# Patient Record
Sex: Female | Born: 1950 | Race: White | Hispanic: No | Smoking: Former smoker
Health system: Southern US, Community
[De-identification: ages and names within clinical notes are randomized; demographics above are authoritative.]

## PROBLEM LIST (undated history)

## (undated) DIAGNOSIS — T7840XA Allergy, unspecified, initial encounter: Secondary | ICD-10-CM

## (undated) HISTORY — DX: Allergy, unspecified, initial encounter: T78.40XA

## (undated) HISTORY — PX: TUBAL LIGATION: SHX77

---

## 2002-06-11 ENCOUNTER — Encounter: Payer: Self-pay | Admitting: Family Medicine

## 2002-06-11 ENCOUNTER — Encounter: Admission: RE | Admit: 2002-06-11 | Discharge: 2002-06-11 | Payer: Self-pay | Admitting: Family Medicine

## 2002-08-04 ENCOUNTER — Ambulatory Visit (HOSPITAL_COMMUNITY): Admission: RE | Admit: 2002-08-04 | Discharge: 2002-08-04 | Payer: Self-pay | Admitting: *Deleted

## 2012-01-16 ENCOUNTER — Ambulatory Visit (INDEPENDENT_AMBULATORY_CARE_PROVIDER_SITE_OTHER): Payer: BC Managed Care – PPO | Admitting: Family Medicine

## 2012-01-16 VITALS — BP 111/75 | HR 70 | Temp 97.3°F | Resp 16 | Ht 63.0 in | Wt 183.0 lb

## 2012-01-16 DIAGNOSIS — H9209 Otalgia, unspecified ear: Secondary | ICD-10-CM

## 2012-01-16 MED ORDER — OFLOXACIN 0.3 % OT SOLN: 10.0000 [drp] | Freq: Every day | OTIC | Status: AC

## 2012-01-16 NOTE — Progress Notes (Signed)
  Patient Name: ZYIA KANEKO Date of Birth: [birth year] Medical Record Number: [id number] Gender: female Date of Encounter: 01/16/2012  History of Present Illness:  Veronica Joseph is a 61 y.o. very pleasant female patient who presents with the following:  Here today with right ear pain for about one week.  No hearing change, no fever, no drainage, no other symptoms.    There is no problem list on file for this patient.  No past medical history on file. No past surgical history on file. History  Substance Use Topics  . Smoking status: Never Smoker   . Smokeless tobacco: Not on file  . Alcohol Use: Not on file   No family history on file. No Known Allergies  Medication list has been reviewed and updated.  Review of Systems: As per HPI- otherwise negative.   Physical Examination: Filed Vitals:   01/16/12 1448  BP: 111/75  Pulse: 70  Temp: 97.3 F (36.3 C)  TempSrc: Oral  Resp: 16  Height: 5\' 3"  (1.6 m)  Weight: 183 lb (83.008 kg)    Body mass index is 32.42 kg/(m^2).  GEN: WDWN, NAD, Non-toxic, A & O x 3, overweight HEENT: Atraumatic, Normocephalic. Neck supple. No masses, No LAD. Left Tm and ear canal normal- right obscured by wax.   Ears and Nose: No external deformity. CV: RRR, No M/G/R. No JVD. No thrill. No extra heart sounds. PULM: CTA B, no wheezes, crackles, rhonchi. No retractions. No resp. distress. No accessory muscle use. ABD: S, NT, ND, +BS. No rebound. No HSM. EXTR: No c/c/e NEURO Normal gait.  PSYCH: Normally interactive. Conversant. Not depressed or anxious appearing.  Calm demeanor.   Tried for some time to flush wax from ear canal but could not remove it.  Visualized TM is normal Assessment and Plan: 1. Ear pain  ofloxacin (FLOXIN OTIC) 0.3 % otic solution   Cover with floxin otic due to pain and irrigation.  Recommended that she try OTC wax softening drops and then RTC for repeat irrigation if she continues to have pain.

## 2012-05-09 ENCOUNTER — Ambulatory Visit (INDEPENDENT_AMBULATORY_CARE_PROVIDER_SITE_OTHER): Payer: BC Managed Care – PPO | Admitting: Emergency Medicine

## 2012-05-09 VITALS — BP 127/80 | HR 76 | Temp 98.0°F | Resp 16 | Ht 64.0 in | Wt 181.8 lb

## 2012-05-09 DIAGNOSIS — J029 Acute pharyngitis, unspecified: Secondary | ICD-10-CM

## 2012-05-09 LAB — POCT RAPID STREP A (OFFICE): Rapid Strep A Screen: NEGATIVE

## 2012-05-09 MED ORDER — PREDNISONE 10 MG PO TABS: ORAL_TABLET | ORAL | Status: DC

## 2012-05-09 NOTE — Progress Notes (Signed)
  Subjective:    Patient ID: Veronica Joseph, female    DOB: April 04, 1951, 61 y.o.   MRN: [id number]  HPI redness around both eyes. Bright red looks like a "racoon". No change in soaps, makeup, skin care, nothing. Feels "leathery and scruffy"  Woke up yesterday morning with scratchy throat, dry cough, and low grade fever   Review of Systems     Objective:   Physical Exam there is some dry scaly redness to the skin which isn't in a raccoon distribution around both eyes. Her pupils are equal and reactive to light. Neck is supple. Chest is clear TMs are clear.    Results for orders placed in visit on 05/09/12  POCT RAPID STREP A (OFFICE)      Component Value Range   Rapid Strep A Screen Negative  Negative      Assessment & Plan:  Patient with what appears to be irritation around both eyes. She did clean out a storage area and had a lot of exposure to dust around her eyes and this may be the precipitant . to keep all ointments and cremes off her face. we'll treat with prednisone. Recheck if symptoms continue

## 2014-04-01 ENCOUNTER — Ambulatory Visit (INDEPENDENT_AMBULATORY_CARE_PROVIDER_SITE_OTHER): Payer: BC Managed Care – PPO | Admitting: Family Medicine

## 2014-04-01 VITALS — BP 118/84 | HR 80 | Temp 97.2°F | Resp 16 | Ht 64.0 in | Wt 192.6 lb

## 2014-04-01 DIAGNOSIS — M7580 Other shoulder lesions, unspecified shoulder: Secondary | ICD-10-CM

## 2014-04-01 DIAGNOSIS — M7540 Impingement syndrome of unspecified shoulder: Secondary | ICD-10-CM

## 2014-04-01 DIAGNOSIS — M25819 Other specified joint disorders, unspecified shoulder: Secondary | ICD-10-CM

## 2014-04-01 MED ORDER — PREDNISONE 20 MG PO TABS: ORAL_TABLET | ORAL | Status: DC

## 2014-04-01 NOTE — Progress Notes (Signed)
° °  Subjective:  Patient ID: Veronica Joseph, female    DOB: [birth year], 63 y.o.   MRN: [id number] This chart was scribed for Elvina Sidle, MD by Marica Otter, ED Scribe at [facility]. This patient was seen in room Room 5 and the patient's care was started at 11:48 AM.  HPI HPI Comments: Veronica Joseph is a 63 y.o. female who presents to the [facility] complaining of intermittent left arm pain onset 3-4 weeks ago. Specifically, pt reports that raising her left arm or moving her left arm up above the shoulders causes severe pain. Pt denies neck pain or neck stiffness and reports ROM of the neck is at baseline. Pt reports trying to treat the pain with ice and heat and otc meds without much relief. Pt reports she was helping her daughter move before the start of her Sx; as such, pt was doing quite a bit of lifting and reaching. Pt reports she is done with the move.   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     Review of Systems  Constitutional: Negative for fever and chills.  Musculoskeletal: Negative for neck pain and neck stiffness.       Left arm pain    Objective:  Physical Exam CONSTITUTIONAL: Well developed/well nourished HEAD: Normocephalic/atraumatic EYES: EOMI/PERRL ENMT: Mucous membranes moist NECK: supple no meningeal signs SPINE:entire spine nontender CV: S1/S2 noted, no murmurs/rubs/gallops noted LUNGS: Lungs are clear to auscultation bilaterally, no apparent distress ABDOMEN: soft, nontender, no rebound or guarding GU:no cva tenderness NEURO: Pt is awake/alert, moves all extremitiesx4 EXTREMITIES: pulses normal; ROM normal; findings consistent with left shoulder impingement  Abduction with beyond 90 degrees; and internal rotation to 90 degrees; shoulder is non tender; no bony apnormality or swelling; overlying skin is unremarkable  SKIN: warm, color  normal PSYCH: no abnormalities of mood noted  Assessment & Plan:  DIAGNOSTIC STUDIES: Oxygen Saturation is 99% on RA, normal by my interpretation.     Shoulder impingement - Plan: predniSONE (DELTASONE) 20 MG tablet  Signed, Elvina Sidle, MD

## 2014-04-01 NOTE — Patient Instructions (Signed)

## 2014-04-18 ENCOUNTER — Ambulatory Visit (INDEPENDENT_AMBULATORY_CARE_PROVIDER_SITE_OTHER): Payer: BC Managed Care – PPO | Admitting: Family Medicine

## 2014-04-18 VITALS — BP 122/80 | HR 82 | Temp 97.4°F | Resp 16 | Ht 63.0 in | Wt 190.4 lb

## 2014-04-18 DIAGNOSIS — IMO0002 Reserved for concepts with insufficient information to code with codable children: Secondary | ICD-10-CM

## 2014-04-18 DIAGNOSIS — S46012S Strain of muscle(s) and tendon(s) of the rotator cuff of left shoulder, sequela: Secondary | ICD-10-CM

## 2014-04-18 NOTE — Patient Instructions (Signed)
We will get you referred to an orthopedist for your shoulder.   In the meantime try to keep your shoulder moving as much as you can.   I do think you have a strain of your rotator cuff, but hopefully do not have a tear.  If you don't hear about your appointment in the next day or so please let me know!

## 2014-04-18 NOTE — Progress Notes (Signed)
[facility] [address], [place] [address] [phone]  Date:  04/18/2014   Name:  Veronica Joseph   DOB:  January 01, 1951   MRN:  [id number]  PCP:  No PCP Per Patient    Chief Complaint: Follow-up   History of Present Illness:  Veronica Joseph is a 63 y.o. very pleasant female patient who presents with the following:  Here today to recheck a shoulder issue.  She was here about 15 days ago with complain of left shoulder pain for a few weeks.  She was treated with PO prednisone and given exercises for impingement syndrome.  She is back because she has not improved and was told to follow-up in this case.  She cannot internally rotate or abduct.  He pain seems to be over the lateral upper arm.   The prednisone did not seem to help. She is not aware of any particular injury, but was helping her daughter to move recently and did do some overhead lifting.  She has never had this in the past, but the right shoulder is ok.   She is generally healthy.  She is not established with orthopedics currently.    There are no active problems to display for this patient.   History reviewed. No pertinent past medical history.  History reviewed. No pertinent past surgical history.  History  Substance Use Topics  . Smoking status: Never Smoker   . Smokeless tobacco: Not on file  . Alcohol Use: Not on file    History reviewed. No pertinent family history.  No Known Allergies  Medication list has been reviewed and updated.  Current Outpatient Prescriptions on File Prior to Visit  Medication Sig Dispense Refill  . predniSONE (DELTASONE) 20 MG tablet Two daily with food  10 tablet  0   No current facility-administered medications on file prior to visit.    Review of Systems:  As per HPI- otherwise negative.   Physical Examination: Filed Vitals:   04/18/14 1357  BP: 122/80  Pulse: 82  Temp: 97.4 F (36.3 C)  Resp: 16   Filed Vitals:   04/18/14 1357  Height:  5\' 3"  (1.6 m)  Weight: 190 lb 6.4 oz (86.365 kg)   Body mass index is 33.74 kg/(m^2). Ideal Body Weight: Weight in (lb) to have BMI = 25: 140.8  GEN: WDWN, NAD, Non-toxic, A & O x 3, overweight, looks well HEENT: Atraumatic, Normocephalic. Neck supple. No masses, No LAD. Ears and Nose: No external deformity. CV: RRR, No M/G/R. No JVD. No thrill. No extra heart sounds. PULM: CTA B, no wheezes, crackles, rhonchi. No retractions. No resp. distress. No accessory muscle use. ABD: S, NT, ND, +BS. No rebound. No HSM. EXTR: No c/c/e NEURO Normal gait.  PSYCH: Normally interactive. Conversant. Not depressed or anxious appearing.  Calm demeanor.  Left shoulder; able to flex to about 110 degrees.  Can abduct to 90 degrees.  External rotation and internal rotation about 30% less than I would expect.  Normal strength, negative empty can test.  Tender over the anterior RCT insertion  Assessment and Plan: Rotator cuff strain, left, sequela - Plan: Ambulatory referral to Orthopedic Surgery  Veronica Joseph is here today with a left rotator cuff strain.  Do not suspect a tear, but as she has not improved yet will refer to ortho- may need a joint injection and formal PT.  Made appt for later this week and she was called regarding her appt.  Signed Lamar Blinks, MD

## 2014-07-23 ENCOUNTER — Ambulatory Visit (INDEPENDENT_AMBULATORY_CARE_PROVIDER_SITE_OTHER): Payer: BC Managed Care – PPO | Admitting: Physician Assistant

## 2014-07-23 VITALS — BP 118/80 | HR 78 | Temp 97.7°F | Resp 14 | Ht 62.75 in | Wt 180.0 lb

## 2014-07-23 DIAGNOSIS — H5789 Other specified disorders of eye and adnexa: Secondary | ICD-10-CM

## 2014-07-23 DIAGNOSIS — Z23 Encounter for immunization: Secondary | ICD-10-CM

## 2014-07-23 NOTE — Patient Instructions (Signed)
Can use Normal Saline drops to wash eye out. Redness should clear up in a few days

## 2014-07-23 NOTE — Progress Notes (Signed)
   Subjective:    Patient ID: Veronica Joseph, female    DOB: 1951/07/11, 63 y.o.   MRN: [id number]  HPI Patient presents to clinic this morning after waking last night with R eye itching. She was able to get a small grain of sand out of eye (grain likely from facial wash granules). Eye is no longer itching however is now red. Patient wanted to confirm it was not pink eye. She has not tried used cool cloth or eye drops. She denies recent illness and says seasonal allergies well controlled with Claritin.   Review of Systems  Constitutional: Negative.   HENT: Negative for congestion, dental problem, ear discharge, ear pain, facial swelling, postnasal drip, rhinorrhea, sinus pressure, sneezing and sore throat.   Eyes: Positive for discharge and redness. Negative for photophobia, pain, itching and visual disturbance.       No mucus, burning, swelling, trauma, or change in vision. Watery discharge. Foreign body feeling last night; not currently present.  Respiratory: Negative for cough, chest tightness, shortness of breath and wheezing.   Cardiovascular: Negative.   Gastrointestinal: Negative.   Endocrine: Negative.   Genitourinary: Negative.   Musculoskeletal: Negative.   Skin: Negative.   Allergic/Immunologic: Positive for environmental allergies. Negative for food allergies.  Neurological: Negative for dizziness, light-headedness and headaches.       Objective:   Physical Exam  Constitutional: She is oriented to person, place, and time. She appears well-developed and well-nourished.  HENT:  Head: Normocephalic and atraumatic.  Right Ear: External ear normal.  Left Ear: External ear normal.  Mouth/Throat: Oropharynx is clear and moist.  Eyes: EOM are normal. Pupils are equal, round, and reactive to light. Lids are everted and swept, no foreign bodies found. Right eye exhibits discharge. Right eye exhibits no exudate and no hordeolum. No foreign body present in the right eye. Left eye  exhibits no discharge, no exudate and no hordeolum. No foreign body present in the left eye. Right conjunctiva is injected. Right conjunctiva has no hemorrhage. Left conjunctiva is not injected. Left conjunctiva has no hemorrhage. No scleral icterus.    R eye mildly injected.  Neck: Neck supple. No JVD present. No thyromegaly present.  Cardiovascular: Normal rate, regular rhythm and normal heart sounds.  Exam reveals no gallop and no friction rub.   No murmur heard. Pulmonary/Chest: Effort normal and breath sounds normal. No respiratory distress. She has no wheezes. She has no rales.  Abdominal: Soft. Bowel sounds are normal. She exhibits no mass. There is no tenderness. There is no guarding.  Lymphadenopathy:    She has no cervical adenopathy.  Neurological: She is alert and oriented to person, place, and time.  Skin: Skin is warm and dry. No rash noted. No erythema.       Assessment & Plan:   1. Redness of eye, right Can use normal saline drops to wash eye out. Redness should clear up in a few days as foreign body (sand granule) that cause redness has been removed by patient.   2. Needs flu shot - Flu Vaccine QUAD 36+ mos IM  3. Need for Tdap vaccination - Tdap vaccine greater than or equal to 7yo IM

## 2014-07-23 NOTE — Progress Notes (Signed)
I was directly involved with the patient's care and agree with the physical, diagnosis and treatment plan.  

## 2014-08-07 ENCOUNTER — Ambulatory Visit (INDEPENDENT_AMBULATORY_CARE_PROVIDER_SITE_OTHER): Payer: BC Managed Care – PPO | Admitting: Emergency Medicine

## 2014-08-07 VITALS — BP 114/72 | HR 75 | Temp 97.8°F | Resp 16 | Ht 63.0 in | Wt 185.0 lb

## 2014-08-07 DIAGNOSIS — H1030 Unspecified acute conjunctivitis, unspecified eye: Secondary | ICD-10-CM

## 2014-08-07 MED ORDER — OFLOXACIN 0.3 % OP SOLN: 1.0000 [drp] | OPHTHALMIC | Status: DC

## 2014-08-07 NOTE — Patient Instructions (Signed)

## 2014-08-07 NOTE — Progress Notes (Signed)
[facility] [address], [place] [address] [phone]  Date:  08/07/2014   Name:  Veronica Joseph   DOB:  Mar 17, 1951   MRN:  [id number]  PCP:  No PCP Per Patient    Chief Complaint: Conjunctivitis   History of Present Illness:  Veronica Joseph is a 63 y.o. very pleasant female patient who presents with the following:  Seen two weeks ago for conjunctival injection presumed to be related to a foreign body removed by irrigation by the patient. Now has recurrent FB sensation right eye.  Manipulated eye and not able to find a FB.  Generalized injection.  There are no active problems to display for this patient.   Past Medical History  Diagnosis Date  . Allergy     History reviewed. No pertinent past surgical history.  History  Substance Use Topics  . Smoking status: Former Smoker -- 0.25 packs/day    Types: Cigarettes    Quit date: 10/28/1978  . Smokeless tobacco: Never Used  . Alcohol Use: No    No family history on file.  No Known Allergies  Medication list has been reviewed and updated.  Current Outpatient Prescriptions on File Prior to Visit  Medication Sig Dispense Refill  . loratadine (CLARITIN) 10 MG tablet Take 10 mg by mouth daily.       No current facility-administered medications on file prior to visit.    Review of Systems:  As per HPI, otherwise negative.    Physical Examination: Filed Vitals:   08/07/14 0812  BP: 114/72  Pulse: 75  Temp: 97.8 F (36.6 C)  Resp: 16   Filed Vitals:   08/07/14 0812  Height: 5\' 3"  (1.6 m)  Weight: 185 lb (83.915 kg)   Body mass index is 32.78 kg/(m^2). Ideal Body Weight: Weight in (lb) to have BMI = 25: 140.8   GEN: WDWN, NAD, Non-toxic, Alert & Oriented x 3 HEENT: Atraumatic, Normocephalic. No FB.  Generalized injection with marginal exudate Ears and Nose: No external deformity. EXTR: No clubbing/cyanosis/edema NEURO: Normal gait.  PSYCH: Normally interactive.  Conversant. Not depressed or anxious appearing.  Calm demeanor.    Assessment and Plan: Conjunctivitis Ocuflox  Signed,  Phillips OdorJeffery Alfonse Garringer, MD

## 2014-12-01 ENCOUNTER — Ambulatory Visit (INDEPENDENT_AMBULATORY_CARE_PROVIDER_SITE_OTHER): Payer: BC Managed Care – PPO | Admitting: Physician Assistant

## 2014-12-01 VITALS — BP 124/70 | Temp 97.9°F | Resp 18 | Ht 63.0 in | Wt 183.0 lb

## 2014-12-01 DIAGNOSIS — R253 Fasciculation: Secondary | ICD-10-CM

## 2014-12-01 DIAGNOSIS — R258 Other abnormal involuntary movements: Secondary | ICD-10-CM

## 2014-12-01 NOTE — Progress Notes (Signed)
  Medical screening examination/treatment/procedure(s) were performed by non-physician practitioner and as supervising physician I was immediately available for consultation/collaboration.     

## 2014-12-01 NOTE — Patient Instructions (Addendum)
I think the spasms/twitches in your left thumb are mostly likely due to nerve irritation from the repetitive motions of your new job. 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 Please wear the thumb splint while at work to see if that helps at all. Be sure to take the splint off several times daily to ensure you're getting a lot of range of motion with the thumb. I'll be in contact with you with the labs we drew today. If you notice the twitches increasing, moving to different areas, if you notice any weakness, please return to clinic for further evaluation.  Please take ibuprofen up to 600 mg every 6 hours as needed for inflammation. Please return to clinic if the twitching continues after all these measures.

## 2014-12-01 NOTE — Progress Notes (Signed)
Subjective:    Patient ID: Veronica Joseph, female    DOB: Jun 09, 1951, 64 y.o.   MRN: [id number]  Chief Complaint  Patient presents with  . Spasms    Left thumb since [id number] no pain, no fever   Prior to Admission medications   Medication Sig Start Date End Date Taking? Authorizing Provider  loratadine (CLARITIN) 10 MG tablet Take 10 mg by mouth daily.   Yes Historical Provider, MD  ofloxacin (OCUFLOX) 0.3 % ophthalmic solution Place 1 drop into the right eye every 4 (four) hours. Patient not taking: Reported on 12/01/2014 08/07/14   Carmelina DaneJeffery S Anderson, MD   Medications, allergies, past medical history, surgical history, family history, social history and problem list reviewed and updated.  HPI  [age] yof with no pertinent pmh presents with left thumb twitches.  Has had left thumb twitches for past 3 wks. Twitches come on at random times. Last for seconds at a time. Have been steady since they started 3 wks ago. She can go days btwn the twitches. She can actually see the spasm in her thumb when it happens.   She started a new job 5 months ago. 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 She denies any weakness, any twitches anywhere else, any pain, any trauma to the area. Pt is concerned about parkinsons.   Denies progressive nature of twitch. Has been steady since onset. No trouble walking.   eReview of Systems No cp, sob, fever, chills.     Objective:   Physical Exam  Constitutional: She is oriented to person, place, and time. She appears well-developed and well-nourished.  Non-toxic appearance. She does not have a sickly appearance. She does not appear ill. No distress.  BP 124/70 mmHg  Temp(Src) 97.9 F (36.6 C) (Oral)  Resp 18  Ht 5\' 3"  (1.6 m)  Wt 183 lb (83.008 kg)  BMI 32.43 kg/m2   Eyes: Conjunctivae and EOM are normal. Pupils are equal, round, and reactive to light.  Neck: No thyroid mass and no thyromegaly present.    Cardiovascular: Normal rate, regular rhythm and normal heart sounds.   Pulmonary/Chest: Effort normal and breath sounds normal.  Musculoskeletal: Normal range of motion.       Left hand: She exhibits normal range of motion, no tenderness, no bony tenderness, normal capillary refill and no deformity. Normal sensation noted. Normal strength noted.  Left thumb testing normal. No nodule over tendon sheath. Neg finkelsteins testing.   Neurological: She is alert and oriented to person, place, and time. She has normal strength. She displays no tremor. No cranial nerve deficit or sensory deficit. She displays a negative Romberg sign. Coordination and gait normal.  No slowed rapid alternating movements.   Skin: Skin is warm and dry. No rash noted. Nails show no clubbing.  Psychiatric: She has a normal mood and affect. Her speech is normal and behavior is normal.      Assessment & Plan:   [age] yof with no pertinent pmh presents with left thumb twitches.  Muscle twitch - Plan: Magnesium, Comprehensive metabolic panel, TSH --most likely due to repetitive nature of new job causing nerve irritation --change work motions, thumb splint, nsaids prn --doubt als/parkinsons/ms --> is twitch not a tremor, normal neuro testing, normal strength, has not been progressive over these past few wks, denies any changes anywhere else --tsh, cmp, mag to check for lyte abnor, thyroid issues as poss causes --rtc if no improvement with  these measures, if worsens, if any weakness, if moves to other areas  Donnajean Lopes, PA-C Physician Assistant-Certified [facility]  12/01/2014 7:30 PM

## 2014-12-02 LAB — COMPREHENSIVE METABOLIC PANEL
ALK PHOS: 72 U/L (ref 39–117)
ALT: 15 U/L (ref 0–35)
AST: 20 U/L (ref 0–37)
Albumin: 4.5 g/dL (ref 3.5–5.2)
BUN: 15 mg/dL (ref 6–23)
CALCIUM: 9.4 mg/dL (ref 8.4–10.5)
CHLORIDE: 102 meq/L (ref 96–112)
CO2: 24 mEq/L (ref 19–32)
Creat: 0.75 mg/dL (ref 0.50–1.10)
Glucose, Bld: 97 mg/dL (ref 70–99)
POTASSIUM: 4.2 meq/L (ref 3.5–5.3)
SODIUM: 138 meq/L (ref 135–145)
TOTAL PROTEIN: 7.2 g/dL (ref 6.0–8.3)
Total Bilirubin: 0.6 mg/dL (ref 0.2–1.2)

## 2014-12-02 LAB — MAGNESIUM: MAGNESIUM: 2.2 mg/dL (ref 1.5–2.5)

## 2014-12-02 LAB — TSH: TSH: 1.428 u[IU]/mL (ref 0.350–4.500)

## 2017-04-04 ENCOUNTER — Ambulatory Visit (INDEPENDENT_AMBULATORY_CARE_PROVIDER_SITE_OTHER): Payer: BC Managed Care – PPO | Admitting: Urgent Care

## 2017-04-04 ENCOUNTER — Ambulatory Visit (INDEPENDENT_AMBULATORY_CARE_PROVIDER_SITE_OTHER): Payer: BC Managed Care – PPO

## 2017-04-04 ENCOUNTER — Encounter: Payer: Self-pay | Admitting: Urgent Care

## 2017-04-04 VITALS — BP 128/76 | HR 85 | Temp 97.9°F | Resp 16 | Ht 63.0 in | Wt 177.2 lb

## 2017-04-04 DIAGNOSIS — M79645 Pain in left finger(s): Secondary | ICD-10-CM | POA: Diagnosis not present

## 2017-04-04 DIAGNOSIS — F5109 Other insomnia not due to a substance or known physiological condition: Secondary | ICD-10-CM

## 2017-04-04 DIAGNOSIS — M25542 Pain in joints of left hand: Secondary | ICD-10-CM

## 2017-04-04 MED ORDER — SUVOREXANT 10 MG PO TABS: 1.0000 | ORAL_TABLET | Freq: Every day | ORAL | 1 refills | Status: DC

## 2017-04-04 MED ORDER — MELOXICAM 7.5 MG PO TABS: 7.5000 mg | ORAL_TABLET | Freq: Every day | ORAL | 0 refills | Status: DC

## 2017-04-04 NOTE — Patient Instructions (Addendum)
Suvorexant oral tablets What is this medicine? SUVOREXANT (su-vor-EX-ant) is used to treat insomnia. This medicine helps you to fall asleep and sleep through the night. This medicine may be used for other purposes; ask your health care provider or pharmacist if you have questions. COMMON BRAND NAME(S): Belsomra What should I tell my health care provider before I take this medicine? They need to know if you have any of these conditions: -depression -history of a drug or alcohol abuse problem -history of daytime sleepiness -history of sudden onset of muscle weakness (cataplexy) -liver disease -lung or breathing disease -narcolepsy -suicidal thoughts, plans, or attempt; a previous suicide attempt by you or a family member -an unusual or allergic reaction to suvorexant, other medicines, foods, dyes, or preservatives -pregnant or trying to get pregnant -breast-feeding How should I use this medicine? Take this medicine by mouth within 30 minutes of going to bed. Do not take it unless you are able to stay in bed a full night before you must be active again. Follow the directions on the prescription label. For best results, it is better to take this medicine on an empty stomach. Do not take your medicine more often than directed. Do not stop taking this medicine on your own. Always follow your doctor or health care professional's advice. A special MedGuide will be given to you by the pharmacist with each prescription and refill. Be sure to read this information carefully each time. Talk to your pediatrician regarding the use of this medicine in children. Special care may be needed. Overdosage: If you think you have taken too much of this medicine contact a poison control center or emergency room at once. NOTE: This medicine is only for you. Do not share this medicine with others. What if I miss a dose? This medicine should only be taken immediately before going to sleep. Do not take double or extra  doses. What may interact with this medicine? -alcohol -antiviral medicines for HIV or AIDS -aprepitant -carbamazepine -certain antibiotics like ciprofloxacin, clarithromycin, erythromycin, telithromycin -certain medicines for depression or psychotic disturbances -certain medicines for fungal infections like ketoconazole, posaconazole, fluconazole, or itraconazole -conivaptan -digoxin -diltiazem -grapefruit juice -imatinib -medicines for anxiety or sleep -phenytoin -rifampin -verapamil This list may not describe all possible interactions. Give your health care provider a list of all the medicines, herbs, non-prescription drugs, or dietary supplements you use. Also tell them if you smoke, drink alcohol, or use illegal drugs. Some items may interact with your medicine. What should I watch for while using this medicine? Visit your doctor or health care professional for regular checks on your progress. Keep a regular sleep schedule by going to bed at about the same time each night. Avoid caffeine-containing drinks in the evening hours. When sleep medicines are used every night for more than a few weeks, they may stop working. Do not increase the dose on your own. Talk to your doctor if your insomnia worsens or is not better within 7 to 10 days. After taking this medicine for sleep, you may get up out of bed while not being fully awake and do an activity that you do not know you are doing. The next morning, you may have no memory of the event. Activities such as driving a car ("sleep-driving"), making and eating food, talking on the phone, sexual activity, and sleep-walking have been reported. Call your doctor right away if you find out you have done any of these activities. Do not take this medicine if you have   used alcohol that evening or before bed or taken another medicine for sleep, since your risk of doing these sleep-related activities will be increased. Do not take this medicine unless you  are able to stay in bed for a full night (7 to 8 hours) and do not drive or perform other activities requiring full alertness within 8 hours of a dose. Do not drive, use machinery, or do anything that needs mental alertness the day after you take the 20 mg dose of this medicine. The use of lower doses (10 mg) also has the potential to cause driving impairment the next day. You may have a decrease in mental alertness the day after use, even if you feel that you are fully awake. Tell your doctor if you will need to perform activities requiring full alertness, such as driving, the next day. Do not stand or sit up quickly after taking this medicine, especially if you are an older patient. This reduces the risk of dizzy or fainting spells. If you or your family notice any changes in your behavior, such as new or worsening depression, thoughts of harming yourself, anxiety, other unusual or disturbing thoughts, or memory loss, call your doctor right away. What side effects may I notice from receiving this medicine? Side effects that you should report to your doctor or health care professional as soon as possible: -allergic reactions like skin rash, itching or hives, swelling of the face, lips, or tongue -confusion -depressed mood -feeling faint or lightheaded, falls -hallucinations -inability to move or speak for up to several minutes while you are going to sleep or waking up -memory loss -periods of leg weakness lasting from seconds to a few minutes -problems with balance, speaking, walking -restlessness, excitability, or feelings of agitation -unusual activities while asleep like driving, eating, making phone calls Side effects that usually do not require medical attention (report to your doctor or health care professional if they continue or are bothersome): -abnormal dreams -daytime drowsiness -diarrhea -dizziness -headache This list may not describe all possible side effects. Call your doctor for  medical advice about side effects. You may report side effects to FDA at 1-800-FDA-1088. Where should I keep my medicine? Keep out of the reach of children. This medicine can be abused. Keep your medicine in a safe place to protect it from theft. Do not share this medicine with anyone. Selling or giving away this medicine is dangerous and against the law. Store at room temperature between 15 and 30 degrees C (59 and 86 degrees F). Throw away any unused medicine after the expiration date. NOTE: This sheet is a summary. It may not cover all possible information. If you have questions about this medicine, talk to your doctor, pharmacist, or health care provider.  2018 Elsevier/Gold Standard (2015-11-16 11:54:49)    Common side effects: Drowsiness (2% to 12%; dose dependent and more common in females), headache (7%; more common in females), dizziness (3%), abnormal dreams (2%; more common in females), abnormality in thinking, amnesia, anxiety, behavioral changes, central nervous system depression, drug abuse, drug dependence, exacerbation of depression, hallucination, hypnagogic hallucinations, sleep driving, suicidal ideation Endocrine & metabolic: Increased serum cholesterol Gastrointestinal: Diarrhea (2%), xerostomia ( 2%; more common in females) Neuromuscular & skeletal: Lower extremity weakness, sleep paralysis Respiratory: Cough ( 2%; more common in females), upper respiratory tract infection ( 2%; more common in females)     IF you received an x-ray today, you will receive an invoice from [facility]. Please contact [facility] at [phone]  with questions or concerns regarding your invoice.   IF you received labwork today, you will receive an invoice from [facility]. Please contact [facility] at [phone] with questions or concerns regarding your invoice.   Our billing staff will not be able to assist you with questions regarding bills from these companies.  You will  be contacted with the lab results as soon as they are available. The fastest way to get your results is to activate your My Chart account. Instructions are located on the last page of this paperwork. If you have not heard from us regarding the results in 2 weeks, please contact this office.

## 2017-04-04 NOTE — Progress Notes (Signed)
  MRN: [id number] DOB: [birth year]  Subjective:   Veronica Joseph is a 66 y.o. female presenting for chief complaint of joint pain (x3 weeks; left index finger/left shoulder/left elbow)  Finger pain - Reports 3 week history of left index finger pain at PIP joint. Has had difficulty with flexion, pain is very sharp and intermittent. She does work at [employer] that involves frequent heavy lifting of boxes and due to her pain has dropped a few boxes. Has been using ibuprofen at times twice daily but there is no improvement. Denies fever, redness, swelling, warmth, bruising or trauma. Has a history of OA of left shoulder. Admits intermittent pain of her left elbow and shoulder but would like to focus in on her finger pain.  Insomnia - Reports several month history of difficulty staying asleep. She averages 4-6 hours per night. Admits that she has a stressful job, moved into this new department this past year. Does not feel fatigue, day time sleepiness, irritability, confusion, myalgia. Practices good sleep hygiene, does not drink caffeine regularly or coffee, no electronics in the bed room, does not eat late dinners, uses bedroom for sleep. Has tried otc sleep aids without relief.  Veronica Joseph has a current medication list which includes the following prescription(s): loratadine. Also has No Known Allergies. Veronica Joseph  has a past medical history of Allergy. Also has past surgical history.  Objective:   Vitals: BP 128/76   Pulse 85   Temp 97.9 F (36.6 C) (Oral)   Resp 16   Ht 5\' 3"  (1.6 m)   Wt 177 lb 3.2 oz (80.4 kg)   SpO2 97%   BMI 31.39 kg/m   Physical Exam  Constitutional: She is oriented to person, place, and time. She appears well-developed and well-nourished.  HENT:  Mouth/Throat: Oropharynx is clear and moist.  Eyes: EOM are normal. Pupils are equal, round, and reactive to light. No scleral icterus.  Neck: Normal range of motion. Neck supple. No thyromegaly present.  Cardiovascular:  Normal rate, regular rhythm and intact distal pulses.  Exam reveals no gallop and no friction rub.   No murmur heard. Pulmonary/Chest: No respiratory distress. She has no wheezes. She has no rales.  Neurological: She is alert and oriented to person, place, and time.  Skin: Skin is warm and dry.  Psychiatric: She has a normal mood and affect.   Dg Finger Index Left  Result Date: 04/04/2017 CLINICAL DATA:  Pain EXAM: LEFT SECOND FINGER 2+V COMPARISON:  None. FINDINGS: Frontal, oblique, and lateral views were obtained. There is no fracture or dislocation. Joint spaces appear normal. No erosive change or periostitis. IMPRESSION: No fracture or dislocation.  No evident arthropathy. Electronically Signed   By: Bretta BangWilliam  Woodruff III M.D.   On: 04/04/2017 16:55   Assessment and Plan :   1. Finger pain, left 2. Pain involving joint of finger of left hand - Inflammatory process, labs pending, likely due to overuse at work. Offered NSAID with meloxicam, work restrictions. Follow up in 2 weeks for re-evaluation.  3. Late insomnia - Labs pending, counseled on sleep hygiene. I suspect her job is causing her more stress than she is recognizing. However, if sleep hygiene modifications do not work, patient will start trial of Belsomra.  Wallis BambergMario Artemisia Auvil, PA-C [facility] [phone] 04/04/2017  4:04 PM

## 2017-04-08 LAB — RHEUMATOID ARTHRITIS PROFILE
Cyclic Citrullin Peptide Ab: 22 units — ABNORMAL HIGH (ref 0–19)
RHEUMATOID FACTOR: 10.4 [IU]/mL (ref 0.0–13.9)

## 2017-04-08 LAB — CBC
HEMOGLOBIN: 14.6 g/dL (ref 11.1–15.9)
Hematocrit: 43.7 % (ref 34.0–46.6)
MCH: 30.7 pg (ref 26.6–33.0)
MCHC: 33.4 g/dL (ref 31.5–35.7)
MCV: 92 fL (ref 79–97)
PLATELETS: 329 10*3/uL (ref 150–379)
RBC: 4.75 x10E6/uL (ref 3.77–5.28)
RDW: 13.1 % (ref 12.3–15.4)
WBC: 5.9 10*3/uL (ref 3.4–10.8)

## 2017-04-08 LAB — URIC ACID: URIC ACID: 4.5 mg/dL (ref 2.5–7.1)

## 2017-04-08 LAB — TSH: TSH: 2.39 u[IU]/mL (ref 0.450–4.500)

## 2017-04-08 LAB — SEDIMENTATION RATE: Sed Rate: 3 mm/hr (ref 0–40)

## 2017-04-08 LAB — C-REACTIVE PROTEIN: CRP: 0.8 mg/L (ref 0.0–4.9)

## 2017-04-10 ENCOUNTER — Other Ambulatory Visit: Payer: Self-pay | Admitting: Urgent Care

## 2017-04-10 DIAGNOSIS — M79645 Pain in left finger(s): Secondary | ICD-10-CM

## 2017-04-14 ENCOUNTER — Telehealth: Payer: Self-pay | Admitting: Urgent Care

## 2017-04-14 DIAGNOSIS — R768 Other specified abnormal immunological findings in serum: Secondary | ICD-10-CM

## 2017-04-14 NOTE — Telephone Encounter (Signed)
Placed referral for patient to be seen and worked up by rheumatology.

## 2017-04-14 NOTE — Telephone Encounter (Signed)
I think you already placed referral Please advise

## 2017-04-14 NOTE — Telephone Encounter (Signed)
Pt is calling back for Urban GibsonMani to let him know to go ahead with the referral   Best number [phone]

## 2017-05-01 ENCOUNTER — Other Ambulatory Visit: Payer: Self-pay | Admitting: Urgent Care

## 2017-11-24 ENCOUNTER — Encounter: Payer: Self-pay | Admitting: Physician Assistant

## 2017-11-24 ENCOUNTER — Ambulatory Visit: Payer: BC Managed Care – PPO | Admitting: Physician Assistant

## 2017-11-24 VITALS — BP 130/70 | HR 68 | Temp 98.4°F | Resp 16 | Ht 63.0 in | Wt 175.0 lb

## 2017-11-24 DIAGNOSIS — H6121 Impacted cerumen, right ear: Secondary | ICD-10-CM | POA: Diagnosis not present

## 2017-11-24 DIAGNOSIS — H6091 Unspecified otitis externa, right ear: Secondary | ICD-10-CM | POA: Diagnosis not present

## 2017-11-24 MED ORDER — ACETIC ACID 2 % OT SOLN: 4.0000 [drp] | Freq: Three times a day (TID) | OTIC | 0 refills | Status: AC

## 2017-11-24 MED ORDER — CARBAMIDE PEROXIDE 6.5 % OT SOLN: 5.0000 [drp] | Freq: Two times a day (BID) | OTIC | 0 refills | Status: DC

## 2017-11-24 NOTE — Progress Notes (Signed)
PR[facility] AT [facility] [address], [place] [id number] [phone]  Date:  11/24/2017   Name:  Veronica Joseph   DOB:  Jun 04, 1951   MRN:  [id number]  PCP:  Patient, No Pcp Per    History of Present Illness:  Veronica Joseph is a 67 y.o. female patient who presents to PCP with  Chief Complaint  Patient presents with  . Otalgia    Right ear pain since Friday - states sound is muted and tender to touch     3 days of right ear pain.  It is a sharp jolt like pain that can radiates down the ear and halfway down the neck. No better with pain.  She has not noted pain there.  She has an aggravated sensation when she turns her head or with swallowing.    No fever.   No congestion, runny nose, but has noted some hearing loss.  She tried some hair dryer which helped.  Ibuprofen did not help much.  There are no active problems to display for this patient.   Past Medical History:  Diagnosis Date  . Allergy     Past Surgical History:  Procedure Laterality Date  . TUBAL LIGATION      Social History   Tobacco Use  . Smoking status: Former Smoker    Packs/day: 0.25    Types: Cigarettes    Last attempt to quit: 10/28/1978    Years since quitting: 39.1  . Smokeless tobacco: Never Used  Substance Use Topics  . Alcohol use: No  . Drug use: No    History reviewed. No pertinent family history.  No Known Allergies  Medication list has been reviewed and updated.  Current Outpatient Medications on File Prior to Visit  Medication Sig Dispense Refill  . loratadine (CLARITIN) 10 MG tablet Take 10 mg by mouth daily.    . meloxicam (MOBIC) 7.5 MG tablet TAKE 1 TABLET BY MOUTH EVERY DAY (Patient not taking: Reported on 11/24/2017) 15 tablet 0  . Suvorexant (BELSOMRA) 10 MG TABS Take 1 tablet by mouth at bedtime. (Patient not taking: Reported on 11/24/2017) 30 tablet 1   No current facility-administered medications on file prior to visit.     ROS ROS otherwise unremarkable unless  listed above.  Physical Examination: BP 130/70   Pulse 68   Temp 98.4 F (36.9 C) (Oral)   Resp 16   Ht 5\' 3"  (1.6 m)   Wt 175 lb (79.4 kg)   SpO2 99%   BMI 31.00 kg/m  Ideal Body Weight: Weight in (lb) to have BMI = 25: 140.8  Physical Exam  Constitutional: She is oriented to person, place, and time. She appears well-developed and well-nourished. No distress.  HENT:  Head: Normocephalic and atraumatic.  Right Ear: External ear and ear Joseph normal.  Left Ear: Tympanic membrane, external ear and ear Joseph normal.  Nose: Mucosal edema and rhinorrhea present. Right sinus exhibits no maxillary sinus tenderness and no frontal sinus tenderness. Left sinus exhibits no maxillary sinus tenderness and no frontal sinus tenderness.  Mouth/Throat: No uvula swelling. No oropharyngeal exudate, posterior oropharyngeal edema or posterior oropharyngeal erythema.  TM blocked cerumen impaction.  Eyes: Conjunctivae and EOM are normal. Pupils are equal, round, and reactive to light.  Cardiovascular: Normal rate and regular rhythm. Exam reveals no gallop, no distant heart sounds and no friction rub.  No murmur heard. Pulmonary/Chest: Effort normal. No respiratory distress. She has no decreased breath sounds. She has no  wheezes. She has no rhonchi.  Lymphadenopathy:       Head (right side): No submandibular, no tonsillar, no preauricular and no posterior auricular adenopathy present.       Head (left side): No submandibular, no tonsillar, no preauricular and no posterior auricular adenopathy present.  Neurological: She is alert and oriented to person, place, and time.  Skin: She is not diaphoretic.  Psychiatric: She has a normal mood and affect. Her behavior is normal.    Assessment and Plan: Veronica Joseph is a 67 y.o. female who is here today for cc of  Chief Complaint  Patient presents with  . Otalgia    Right ear pain since Friday - states sound is muted and tender to touch   Inflammation of  ear Joseph, right - Plan: carbamide peroxide (DEBROX) 6.5 % OTIC solution, acetic acid 2 % otic solution  Impacted cerumen of right ear - Plan: carbamide peroxide (DEBROX) 6.5 % OTIC solution, acetic acid 2 % otic solution  Trena Platt, PA-C [facility] 1/29/201911:09 AM

## 2017-11-24 NOTE — Patient Instructions (Addendum)
  Earwax Buildup, Adult The ears produce a substance called earwax that helps keep bacteria out of the ear and protects the skin in the ear canal. Occasionally, earwax can build up in the ear and cause discomfort or hearing loss. What increases the risk? This condition is more likely to develop in people who:  Are female.  Are elderly.  Naturally produce more earwax.  Clean their ears often with cotton swabs.  Use earplugs often.  Use in-ear headphones often.  Wear hearing aids.  Have narrow ear canals.  Have earwax that is overly thick or sticky.  Have eczema.  Are dehydrated.  Have excess hair in the ear canal.  What are the signs or symptoms? Symptoms of this condition include:  Reduced or muffled hearing.  A feeling of fullness in the ear or feeling that the ear is plugged.  Fluid coming from the ear.  Ear pain.  Ear itch.  Ringing in the ear.  Coughing.  An obvious piece of earwax that can be seen inside the ear canal.  How is this diagnosed? This condition may be diagnosed based on:  Your symptoms.  Your medical history.  An ear exam. During the exam, your health care provider will look into your ear with an instrument called an otoscope.  You may have tests, including a hearing test. How is this treated? This condition may be treated by:  Using ear drops to soften the earwax.  Having the earwax removed by a health care provider. The health care provider may: ? Flush the ear with water. ? Use an instrument that has a loop on the end (curette). ? Use a suction device.  Surgery to remove the wax buildup. This may be done in severe cases.  Follow these instructions at home:  Take over-the-counter and prescription medicines only as told by your health care provider.  Do not put any objects, including cotton swabs, into your ear. You can clean the opening of your ear canal with a washcloth or facial tissue.  Follow instructions from your  health care provider about cleaning your ears. Do not over-clean your ears.  Drink enough fluid to keep your urine clear or pale yellow. This will help to thin the earwax.  Keep all follow-up visits as told by your health care provider. If earwax builds up in your ears often or if you use hearing aids, consider seeing your health care provider for routine, preventive ear cleanings. Ask your health care provider how often you should schedule your cleanings.  If you have hearing aids, clean them according to instructions from the manufacturer and your health care provider. Contact a health care provider if:  You have ear pain.  You develop a fever.  You have blood, pus, or other fluid coming from your ear.  You have hearing loss.  You have ringing in your ears that does not go away.  Your symptoms do not improve with treatment.  You feel like the room is spinning (vertigo). Summary  Earwax can build up in the ear and cause discomfort or hearing loss.  The most common symptoms of this condition include reduced or muffled hearing and a feeling of fullness in the ear or feeling that the ear is plugged.  This condition may be diagnosed based on your symptoms, your medical history, and an ear exam.  This condition may be treated by using ear drops to soften the earwax or by having the earwax removed by a health care provider.    Do not put any objects, including cotton swabs, into your ear. You can clean the opening of your ear canal with a washcloth or facial tissue. This information is not intended to replace advice given to you by your health care provider. Make sure you discuss any questions you have with your health care provider. Document Released: 11/21/2004 Document Revised: 12/25/2016 Document Reviewed: 12/25/2016 Elsevier Interactive Patient Education  2018 Elsevier Inc.   IF you received an x-ray today, you will receive an invoice from [facility]. Please contact  [facility] at [phone] with questions or concerns regarding your invoice.   IF you received labwork today, you will receive an invoice from [facility]. Please contact [facility] at [phone] with questions or concerns regarding your invoice.   Our billing staff will not be able to assist you with questions regarding bills from these companies.  You will be contacted with the lab results as soon as they are available. The fastest way to get your results is to activate your My Chart account. Instructions are located on the last page of this paperwork. If you have not heard from us regarding the results in 2 weeks, please contact this office.     

## 2017-11-25 ENCOUNTER — Encounter: Payer: Self-pay | Admitting: Physician Assistant

## 2018-01-23 ENCOUNTER — Ambulatory Visit: Payer: BC Managed Care – PPO | Admitting: Physician Assistant

## 2018-02-04 ENCOUNTER — Encounter: Payer: Self-pay | Admitting: Physician Assistant

## 2018-10-06 ENCOUNTER — Telehealth: Payer: Self-pay | Admitting: Emergency Medicine

## 2018-10-06 ENCOUNTER — Other Ambulatory Visit: Payer: Self-pay

## 2018-10-06 ENCOUNTER — Ambulatory Visit: Payer: BC Managed Care – PPO | Admitting: Emergency Medicine

## 2018-10-06 ENCOUNTER — Other Ambulatory Visit: Payer: Self-pay | Admitting: Emergency Medicine

## 2018-10-06 ENCOUNTER — Ambulatory Visit (INDEPENDENT_AMBULATORY_CARE_PROVIDER_SITE_OTHER): Payer: BC Managed Care – PPO

## 2018-10-06 ENCOUNTER — Encounter: Payer: Self-pay | Admitting: Emergency Medicine

## 2018-10-06 VITALS — BP 125/74 | HR 67 | Temp 98.5°F | Resp 14 | Ht 63.0 in | Wt 188.0 lb

## 2018-10-06 DIAGNOSIS — M25561 Pain in right knee: Secondary | ICD-10-CM | POA: Diagnosis not present

## 2018-10-06 MED ORDER — DICLOFENAC SODIUM 75 MG PO TBEC: 75.0000 mg | DELAYED_RELEASE_TABLET | Freq: Two times a day (BID) | ORAL | 0 refills | Status: DC

## 2018-10-06 MED ORDER — MELOXICAM 7.5 MG PO TABS: 7.5000 mg | ORAL_TABLET | Freq: Every day | ORAL | 0 refills | Status: DC

## 2018-10-06 NOTE — Telephone Encounter (Signed)
Thanks. I prescribed diclofenac first so I think this is what was denied.  I just replaced it with Mobic.  Hope it works.

## 2018-10-06 NOTE — Progress Notes (Signed)
Veronica Joseph 67 y.o.   Chief Complaint  Patient presents with  . Knee Pain    right lateral knee pain with no known injury. knee pops have trouble putting weight on knee, trouble walking up steps this has been going on for 3 months    HISTORY OF PRESENT ILLNESS: This is a 67 y.o. female complaining of right knee pain for the past 3 months.  Denies injury.  Pain is sharp, worse in the morning, improve with activity.  No radiation no associated symptoms.  Denies any other joint pains.  HPI   Prior to Admission medications   Medication Sig Start Date End Date Taking? Authorizing Provider  cetirizine (ZYRTEC) 10 MG tablet Take 10 mg by mouth daily.   Yes [provider]  loratadine (CLARITIN) 10 MG tablet Take 10 mg by mouth daily.   Yes [provider]    No Known Allergies  There are no active problems to display for this patient.   Past Medical History:  Diagnosis Date  . Allergy     Past Surgical History:  Procedure Laterality Date  . TUBAL LIGATION      Social History   Socioeconomic History  . Marital status: Divorced    Spouse name: Not on file  . Number of children: Not on file  . Years of education: Not on file  . Highest education level: Not on file  Occupational History  . Not on file  Social Needs  . Financial resource strain: Not on file  . Food insecurity:    Worry: Not on file    Inability: Not on file  . Transportation needs:    Medical: Not on file    Non-medical: Not on file  Tobacco Use  . Smoking status: Former Smoker    Packs/day: 0.25    Types: Cigarettes    Last attempt to quit: 10/28/1978    Years since quitting: 39.9  . Smokeless tobacco: Never Used  Substance and Sexual Activity  . Alcohol use: No  . Drug use: No  . Sexual activity: Not on file  Lifestyle  . Physical activity:    Days per week: Not on file    Minutes per session: Not on file  . Stress: Not on file  Relationships  . Social connections:   Talks on phone: Not on file    Gets together: Not on file    Attends religious service: Not on file    Active member of club or organization: Not on file    Attends meetings of clubs or organizations: Not on file    Relationship status: Not on file  . Intimate partner violence:    Fear of current or ex partner: Not on file    Emotionally abused: Not on file    Physically abused: Not on file    Forced sexual activity: Not on file  Other Topics Concern  . Not on file  Social History Narrative  . Not on file    No family history on file.   Review of Systems  Constitutional: Negative.  Negative for chills and fever.  HENT: Negative.   Eyes: Negative.   Respiratory: Negative.  Negative for cough and shortness of breath.   Cardiovascular: Negative.  Negative for chest pain and palpitations.  Gastrointestinal: Negative for nausea and vomiting.  Genitourinary: Negative.   Musculoskeletal: Positive for joint pain (Right knee pain).  Skin: Negative.  Negative for rash.  Neurological: Negative.  Negative for dizziness and  headaches.  Endo/Heme/Allergies: Negative.   All other systems reviewed and are negative.  Vitals:   10/06/18 0838  BP: 125/74  Pulse: 67  Resp: 14  Temp: 98.5 F (36.9 C)  SpO2: 98%     Physical Exam  Constitutional: She is oriented to person, place, and time. She appears well-developed and well-nourished.  HENT:  Head: Normocephalic.  Eyes: Pupils are equal, round, and reactive to light. EOM are normal.  Neck: Normal range of motion. Neck supple.  Cardiovascular: Normal rate and regular rhythm.  Pulmonary/Chest: Effort normal and breath sounds normal.  Musculoskeletal:  Right knee: No erythema swelling or bruising.  Some crepitation felt on the lateral side.  No tenderness.  Stable in flexion and extension.  Neurological: She is alert and oriented to person, place, and time. No sensory deficit. She exhibits normal muscle tone.  Skin: Skin is warm and  dry. Capillary refill takes less than 2 seconds.  Psychiatric: She has a normal mood and affect. Her behavior is normal.  Vitals reviewed.   Dg Knee Complete 4 Views Right  Result Date: 10/06/2018 CLINICAL DATA:  Right knee pain.  No known injury. EXAM: RIGHT KNEE - COMPLETE 4+ VIEW COMPARISON:  None. FINDINGS: Early spurring in the medial and patellofemoral compartments. Small joint effusion. No acute bony abnormality. Specifically, no fracture, subluxation, or dislocation. IMPRESSION: Early spurring and small joint effusion.  No acute bony abnormality. Electronically Signed   By: Charlett Nose M.D.   On: 10/06/2018 09:24   A total of 25 minutes was spent in the room with the patient, greater than 50% of which was in counseling/coordination of care regarding diagnosis, x-ray findings, treatment, medications, and need for diagnostic imaging as well as orthopedic evaluation.  ASSESSMENT & PLAN: [name] was seen today for knee pain.  Diagnoses and all orders for this visit:  Acute pain of right knee -     DG Knee Complete 4 Views Right; Future -     Ambulatory referral to Orthopedic Surgery -     diclofenac (VOLTAREN) 75 MG EC tablet; Take 1 tablet (75 mg total) by mouth 2 (two) times daily for 5 days. After 5 days take as needed. -     MR Knee Right Wo Contrast; Future    Patient Instructions       If you have lab work done today you will be contacted with your lab results within the next 2 weeks.  If you have not heard from [place] then please contact us. The fastest way to get your results is to register for My Chart.   IF you received an x-ray today, you will receive an invoice from [facility]. Please contact [facility] at [phone] with questions or concerns regarding your invoice.   IF you received labwork today, you will receive an invoice from [facility]. Please contact [facility] at [phone] with questions or concerns regarding your invoice.   Our billing  staff will not be able to assist you with questions regarding bills from these companies.  You will be contacted with the lab results as soon as they are available. The fastest way to get your results is to activate your My Chart account. Instructions are located on the last page of this paperwork. If you have not heard from [place] regarding the results in 2 weeks, please contact this office.      Knee Pain, Adult Many things can cause knee pain. The pain often goes away on its own with time and  rest. If the pain does not go away, tests may be done to find out what is causing the pain. Follow these instructions at home: Activity  Rest your knee.  Do not do things that cause pain.  Avoid activities where both feet leave the ground at the same time (high-impact activities). Examples are running, jumping rope, and doing jumping jacks. General instructions  Take medicines only as told by your doctor.  Raise (elevate) your knee when you are resting. Make sure your knee is higher than your heart.  Sleep with a pillow under your knee.  If told, put ice on the knee: ? Put ice in a plastic bag. ? Place a towel between your skin and the bag. ? Leave the ice on for 20 minutes, 2-3 times a day.  Ask your doctor if you should wear an elastic knee support.  Lose weight if you are overweight. Being overweight can make your knee hurt more.  Do not use any tobacco products. These include cigarettes, chewing tobacco, or electronic cigarettes. If you need help quitting, ask your doctor. Smoking may slow down healing. Contact a doctor if:  The pain does not stop.  The pain changes or gets worse.  You have a fever along with knee pain.  Your knee gives out or locks up.  Your knee swells, and becomes worse. Get help right away if:  Your knee feels warm.  You cannot move your knee.  You have very bad knee pain.  You have chest pain.  You have trouble breathing. Summary  Many things  can cause knee pain. The pain often goes away on its own with time and rest.  Avoid activities that put stress on your knee. These include running and jumping rope.  Get help right away if you cannot move your knee, or if your knee feels warm, or if you have trouble breathing. This information is not intended to replace advice given to you by your health care provider. Make sure you discuss any questions you have with your health care provider. Document Released: 01/10/2009 Document Revised: 10/08/2016 Document Reviewed: 10/08/2016 Elsevier Interactive Patient Education  2017 Elsevier Inc.      Edwina BarthMiguel Coe Angelos, MD [facility]

## 2018-10-06 NOTE — Telephone Encounter (Signed)
Please clarify, to approve what?  Medications?  Thanks

## 2018-10-06 NOTE — Telephone Encounter (Signed)
I tried 3x to get this approved they are requesting Peer to Peer if you would like   Thank you

## 2018-10-06 NOTE — Addendum Note (Signed)
Addended by: Evie LacksSAGARDIA, Johnay Mano J on: 10/06/2018 11:19 AM   Modules accepted: Orders

## 2018-10-06 NOTE — Patient Instructions (Addendum)
     If you have lab work done today you will be contacted with your lab results within the next 2 weeks.  If you have not heard from us then please contact us. The fastest way to get your results is to register for My Chart.   IF you received an x-ray today, you will receive an invoice from [facility]. Please contact [facility] at [phone] with questions or concerns regarding your invoice.   IF you received labwork today, you will receive an invoice from [facility]. Please contact [facility] at [phone] with questions or concerns regarding your invoice.   Our billing staff will not be able to assist you with questions regarding bills from these companies.  You will be contacted with the lab results as soon as they are available. The fastest way to get your results is to activate your My Chart account. Instructions are located on the last page of this paperwork. If you have not heard from us regarding the results in 2 weeks, please contact this office.      Knee Pain, Adult Many things can cause knee pain. The pain often goes away on its own with time and rest. If the pain does not go away, tests may be done to find out what is causing the pain. Follow these instructions at home: Activity  Rest your knee.  Do not do things that cause pain.  Avoid activities where both feet leave the ground at the same time (high-impact activities). Examples are running, jumping rope, and doing jumping jacks. General instructions  Take medicines only as told by your doctor.  Raise (elevate) your knee when you are resting. Make sure your knee is higher than your heart.  Sleep with a pillow under your knee.  If told, put ice on the knee: ? Put ice in a plastic bag. ? Place a towel between your skin and the bag. ? Leave the ice on for 20 minutes, 2-3 times a day.  Ask your doctor if you should wear an elastic knee support.  Lose weight if you are overweight. Being  overweight can make your knee hurt more.  Do not use any tobacco products. These include cigarettes, chewing tobacco, or electronic cigarettes. If you need help quitting, ask your doctor. Smoking may slow down healing. Contact a doctor if:  The pain does not stop.  The pain changes or gets worse.  You have a fever along with knee pain.  Your knee gives out or locks up.  Your knee swells, and becomes worse. Get help right away if:  Your knee feels warm.  You cannot move your knee.  You have very bad knee pain.  You have chest pain.  You have trouble breathing. Summary  Many things can cause knee pain. The pain often goes away on its own with time and rest.  Avoid activities that put stress on your knee. These include running and jumping rope.  Get help right away if you cannot move your knee, or if your knee feels warm, or if you have trouble breathing. This information is not intended to replace advice given to you by your health care provider. Make sure you discuss any questions you have with your health care provider. Document Released: 01/10/2009 Document Revised: 10/08/2016 Document Reviewed: 10/08/2016 Elsevier Interactive Patient Education  2017 Elsevier Inc.  

## 2018-10-06 NOTE — Telephone Encounter (Signed)
mobic was the last thing prescribed for patient

## 2018-10-13 ENCOUNTER — Other Ambulatory Visit: Payer: Self-pay | Admitting: Emergency Medicine

## 2018-10-13 DIAGNOSIS — M25561 Pain in right knee: Secondary | ICD-10-CM

## 2018-11-02 ENCOUNTER — Other Ambulatory Visit: Payer: Self-pay | Admitting: Emergency Medicine

## 2018-11-03 NOTE — Telephone Encounter (Signed)
Requested Prescriptions  Pending Prescriptions Disp Refills  . meloxicam (MOBIC) 7.5 MG tablet [Pharmacy Med Name: MELOXICAM 7.5 MG TABLET] 30 tablet 0    Sig: TAKE 1 TABLET BY MOUTH EVERY DAY     Analgesics:  COX2 Inhibitors Failed - 11/02/2018  8:39 PM      Failed - HGB in normal range and within 360 days    Hemoglobin  Date Value Ref Range Status  04/04/2017 14.6 11.1 - 15.9 g/dL Final         Failed - Cr in normal range and within 360 days    Creat  Date Value Ref Range Status  12/01/2014 0.75 0.50 - 1.10 mg/dL Final         Passed - Patient is not pregnant      Passed - Valid encounter within last 12 months    Recent Outpatient Visits          4 weeks ago Acute pain of right knee   Primary Care at [facility], Eilleen Kempf, [place]   11 months ago Impacted cerumen of right ear   Primary Care at [place], [place], [place]   1 year ago Finger pain, left   Primary Care at [facility], [place], [place]   3 years ago Muscle twitch   Primary Care at [facility], [place], [place]   4 years ago Conjunctivitis, acute   [facility] at [facility], Tessa Lerner, [place]

## 2018-11-16 ENCOUNTER — Ambulatory Visit (INDEPENDENT_AMBULATORY_CARE_PROVIDER_SITE_OTHER): Payer: BC Managed Care – PPO | Admitting: Orthopaedic Surgery

## 2018-11-16 ENCOUNTER — Encounter (INDEPENDENT_AMBULATORY_CARE_PROVIDER_SITE_OTHER): Payer: Self-pay | Admitting: Orthopaedic Surgery

## 2018-11-16 VITALS — BP 111/83 | HR 85

## 2018-11-16 DIAGNOSIS — M25561 Pain in right knee: Secondary | ICD-10-CM

## 2018-11-16 DIAGNOSIS — G8929 Other chronic pain: Secondary | ICD-10-CM

## 2018-11-16 MED ORDER — LIDOCAINE HCL 1 % IJ SOLN
2.0000 mL | INTRAMUSCULAR | Status: AC | PRN
  Administered 2018-11-16: 2 mL

## 2018-11-16 MED ORDER — BUPIVACAINE HCL 0.5 % IJ SOLN
2.0000 mL | INTRAMUSCULAR | Status: AC | PRN
  Administered 2018-11-16: 2 mL via INTRA_ARTICULAR

## 2018-11-16 MED ORDER — METHYLPREDNISOLONE ACETATE 40 MG/ML IJ SUSP
80.0000 mg | INTRAMUSCULAR | Status: AC | PRN
  Administered 2018-11-16: 80 mg

## 2018-11-16 NOTE — Progress Notes (Signed)
Office Visit Note   Patient: Veronica Joseph           Date of Birth: 1950/12/08           MRN: [id number] Visit Date: 11/16/2018              Requested by: Georgina Quint, MD [address] [place], [place] [address] PCP: Patient, No Pcp Per   Assessment & Plan: Visit Diagnoses:  1. Chronic pain of right knee     Plan: Osteoarthritis right knee predominant medial compartment.  Will inject with cortisone and monitor response.  Consider MRI scan if still has problems  Follow-Up Instructions: Return if symptoms worsen or fail to improve.   Orders:  No orders of the defined types were placed in this encounter.  No orders of the defined types were placed in this encounter.     Procedures: Large Joint Inj: R knee on 11/16/2018 1:57 PM Indications: pain and diagnostic evaluation Details: 25 G 1.5 in needle, anteromedial approach  Arthrogram: No  Medications: 2 mL lidocaine 1 %; 2 mL bupivacaine 0.5 %; 80 mg methylPREDNISolone acetate 40 MG/ML Procedure, treatment alternatives, risks and benefits explained, specific risks discussed. Consent was given by the patient. Immediately prior to procedure a time out was called to verify the correct patient, procedure, equipment, support staff and site/side marked as required. Patient was prepped and draped in the usual sterile fashion.       Clinical Data: No additional findings.   Subjective: Chief Complaint  Patient presents with  . Right Knee - Follow-up  . Knee Pain    sharp pain, weakness, popping for 3 months x-ray 09-2018 epic  Mrs. Kubinski relates recurrent problem in her right knee over the last 3 to 4 months.  She is not had any injury or trauma but is experiencing episodic pain and swelling occasional "catching".  Her family physician started her on meloxicam but she notes it has made a difference.  She is having pain along the medial lateral aspect of her knee and oftentimes has trouble getting up and down from a  sitting position or walking up and down stairs. I did review films of her right knee on the PACS system.  There are some degenerative changes in the medial compartment with slight decrease in the joint space and peripheral osteophytes.  Also has some degenerative change at the patellofemoral joint  HPI  Review of Systems   Objective: Vital Signs: BP 111/83   Pulse 85   Physical Exam Constitutional:      Appearance: She is well-developed.  Eyes:     Pupils: Pupils are equal, round, and reactive to light.  Pulmonary:     Effort: Pulmonary effort is normal.  Skin:    General: Skin is warm and dry.  Neurological:     Mental Status: She is alert and oriented to person, place, and time.  Psychiatric:        Behavior: Behavior normal.     Ortho Exam right knee with small effusion.  More medial than lateral joint pain.  Alignment appears to be normal.  Full extension and flexion about 100 degrees without instability.  No calf pain.  Little fullness in the popliteal space but no discomfort.  No ankle swelling.  Neurovascular exam intact  Specialty Comments:  No specialty comments available.  Imaging: No results found.   PMFS History: Patient Active Problem List   Diagnosis Date Noted  . Acute pain of right knee  10/06/2018   Past Medical History:  Diagnosis Date  . Allergy     History reviewed. No pertinent family history.  Past Surgical History:  Procedure Laterality Date  . TUBAL LIGATION     Social History   Occupational History  . Not on file  Tobacco Use  . Smoking status: Former Smoker    Packs/day: 0.25    Types: Cigarettes    Last attempt to quit: 10/28/1978    Years since quitting: 40.0  . Smokeless tobacco: Never Used  Substance and Sexual Activity  . Alcohol use: No  . Drug use: No  . Sexual activity: Not on file     Valeria Batman, MD   Note - This record has been created using [employer].  Chart creation errors have been sought, but  may not always  have been located. Such creation errors do not reflect on  the standard of medical care.

## 2018-11-25 ENCOUNTER — Other Ambulatory Visit: Payer: Self-pay | Admitting: Emergency Medicine

## 2018-12-28 ENCOUNTER — Other Ambulatory Visit: Payer: Self-pay | Admitting: Emergency Medicine

## 2019-03-24 ENCOUNTER — Other Ambulatory Visit: Payer: Self-pay | Admitting: Emergency Medicine

## 2019-03-25 NOTE — Telephone Encounter (Signed)
Patient requesting call back from CMA to discuss why medication was refused. She states that she has not requested this medication too soon.

## 2019-03-26 MED ORDER — MELOXICAM 7.5 MG PO TABS: 7.5000 mg | ORAL_TABLET | Freq: Every day | ORAL | 0 refills | Status: AC

## 2019-03-26 NOTE — Telephone Encounter (Signed)
Patient called back because the message was left on a different number. Patient needs to speak with the CMA regarding the medication.  Please call patient at [phone] as soon as possible.

## 2019-03-26 NOTE — Telephone Encounter (Signed)
Attempted to call pt and there was no answer so I left a message to call back.  

## 2019-03-26 NOTE — Telephone Encounter (Signed)
I have attempted to call pt. There was no answer so I left a message to call back.   It looks like we did not refuse the medication, however if the medication was picked up in March and it is now May then the pt should have roughly 30 or so tabs left for a 90 day supply was sent in March. So she should be out of medication in June. Also she should be getting this medication from her orthopedist and along with the plan of action about the knee pain since she was referred and she has seen one.   Thanks

## 2019-03-26 NOTE — Addendum Note (Signed)
Addended by: Lisbeth Renshaw, Darvin Dials HUA on: 03/26/2019 11:40 AM   Modules accepted: Orders

## 2019-03-26 NOTE — Telephone Encounter (Signed)
I have spoken to the pt. She only has gone to Dr. Marilynne Drivers and they are not taking over her care. She says that they were not helpful and would like to see a different orthopedist. Pt stated that they told her to continue care with PCP. I stated understanding and she would like to stay on the Mobic for now.   She is not ready for surgery as of yet and she will get the MRI if she is thinks that would be an option.   I have refilled the medication.

## 2019-03-26 NOTE — Addendum Note (Signed)
Addended by: Lisbeth Renshaw, Wava Kildow HUA on: 03/26/2019 11:06 AM   Modules accepted: Orders

## 2019-03-26 NOTE — Telephone Encounter (Signed)
Dr.Sagardia,  I have pended the mobic medication.  Please advise on medication refill  Pt was last seen 10/06/18 for knee pain and then saw Dr. Cleophas Dunker, Orthopedic surgery on 11/16/18.

## 2019-03-28 NOTE — Telephone Encounter (Signed)
Thanks

## 2019-04-26 ENCOUNTER — Emergency Department (HOSPITAL_COMMUNITY): Payer: BC Managed Care – PPO

## 2019-04-26 ENCOUNTER — Inpatient Hospital Stay (HOSPITAL_COMMUNITY): Payer: BC Managed Care – PPO

## 2019-04-26 ENCOUNTER — Inpatient Hospital Stay (HOSPITAL_COMMUNITY)
Admission: EM | Admit: 2019-04-26 | Discharge: 2019-04-28 | DRG: 064 | Disposition: E | Payer: BC Managed Care – PPO | Attending: Pulmonary Disease | Admitting: Pulmonary Disease

## 2019-04-26 DIAGNOSIS — I469 Cardiac arrest, cause unspecified: Secondary | ICD-10-CM | POA: Diagnosis present

## 2019-04-26 DIAGNOSIS — Z87891 Personal history of nicotine dependence: Secondary | ICD-10-CM | POA: Diagnosis not present

## 2019-04-26 DIAGNOSIS — Z8674 Personal history of sudden cardiac arrest: Secondary | ICD-10-CM

## 2019-04-26 DIAGNOSIS — I609 Nontraumatic subarachnoid hemorrhage, unspecified: Secondary | ICD-10-CM | POA: Diagnosis not present

## 2019-04-26 DIAGNOSIS — G931 Anoxic brain damage, not elsewhere classified: Secondary | ICD-10-CM | POA: Diagnosis present

## 2019-04-26 DIAGNOSIS — I4901 Ventricular fibrillation: Secondary | ICD-10-CM | POA: Diagnosis present

## 2019-04-26 DIAGNOSIS — Z79899 Other long term (current) drug therapy: Secondary | ICD-10-CM | POA: Diagnosis not present

## 2019-04-26 DIAGNOSIS — J9601 Acute respiratory failure with hypoxia: Secondary | ICD-10-CM | POA: Diagnosis present

## 2019-04-26 DIAGNOSIS — Z1159 Encounter for screening for other viral diseases: Secondary | ICD-10-CM

## 2019-04-26 DIAGNOSIS — I606 Nontraumatic subarachnoid hemorrhage from other intracranial arteries: Principal | ICD-10-CM | POA: Diagnosis present

## 2019-04-26 DIAGNOSIS — Z66 Do not resuscitate: Secondary | ICD-10-CM | POA: Diagnosis not present

## 2019-04-26 DIAGNOSIS — Z791 Long term (current) use of non-steroidal anti-inflammatories (NSAID): Secondary | ICD-10-CM

## 2019-04-26 DIAGNOSIS — Z515 Encounter for palliative care: Secondary | ICD-10-CM | POA: Diagnosis not present

## 2019-04-26 DIAGNOSIS — Z789 Other specified health status: Secondary | ICD-10-CM | POA: Diagnosis not present

## 2019-04-26 DIAGNOSIS — E872 Acidosis: Secondary | ICD-10-CM | POA: Diagnosis present

## 2019-04-26 DIAGNOSIS — G919 Hydrocephalus, unspecified: Secondary | ICD-10-CM | POA: Diagnosis present

## 2019-04-26 DIAGNOSIS — R092 Respiratory arrest: Secondary | ICD-10-CM

## 2019-04-26 LAB — CBC WITH DIFFERENTIAL/PLATELET
Abs Immature Granulocytes: 0 10*3/uL (ref 0.00–0.07)
Basophils Absolute: 0.1 10*3/uL (ref 0.0–0.1)
Basophils Relative: 1 %
Eosinophils Absolute: 0 10*3/uL (ref 0.0–0.5)
Eosinophils Relative: 0 %
HCT: 45.1 % (ref 36.0–46.0)
Hemoglobin: 13.8 g/dL (ref 12.0–15.0)
Lymphocytes Relative: 64 %
Lymphs Abs: 5.1 10*3/uL — ABNORMAL HIGH (ref 0.7–4.0)
MCH: 32.1 pg (ref 26.0–34.0)
MCHC: 30.6 g/dL (ref 30.0–36.0)
MCV: 104.9 fL — ABNORMAL HIGH (ref 80.0–100.0)
Monocytes Absolute: 0.6 10*3/uL (ref 0.1–1.0)
Monocytes Relative: 8 %
Neutro Abs: 2.1 10*3/uL (ref 1.7–7.7)
Neutrophils Relative %: 27 %
Platelets: 281 10*3/uL (ref 150–400)
RBC: 4.3 MIL/uL (ref 3.87–5.11)
RDW: 11.4 % — ABNORMAL LOW (ref 11.5–15.5)
WBC: 7.9 10*3/uL (ref 4.0–10.5)
nRBC: 0.6 % — ABNORMAL HIGH (ref 0.0–0.2)
nRBC: 1 /100 WBC — ABNORMAL HIGH

## 2019-04-26 LAB — PROTIME-INR
INR: 1.4 — ABNORMAL HIGH (ref 0.8–1.2)
Prothrombin Time: 17.2 seconds — ABNORMAL HIGH (ref 11.4–15.2)

## 2019-04-26 LAB — BASIC METABOLIC PANEL
Anion gap: 20 — ABNORMAL HIGH (ref 5–15)
BUN: 21 mg/dL (ref 8–23)
CO2: 10 mmol/L — ABNORMAL LOW (ref 22–32)
Calcium: 8.8 mg/dL — ABNORMAL LOW (ref 8.9–10.3)
Chloride: 101 mmol/L (ref 98–111)
Creatinine, Ser: 1.24 mg/dL — ABNORMAL HIGH (ref 0.44–1.00)
GFR calc Af Amer: 52 mL/min — ABNORMAL LOW (ref >60)
GFR calc non Af Amer: 45 mL/min — ABNORMAL LOW (ref >60)
Glucose, Bld: 434 mg/dL — ABNORMAL HIGH (ref 70–99)
Potassium: 4 mmol/L (ref 3.5–5.1)
Sodium: 131 mmol/L — ABNORMAL LOW (ref 135–145)

## 2019-04-26 LAB — SARS CORONAVIRUS 2 BY RT PCR (HOSPITAL ORDER, PERFORMED IN ~~LOC~~ HOSPITAL LAB): SARS Coronavirus 2: NEGATIVE

## 2019-04-26 LAB — I-STAT CHEM 8, ED
BUN: 25 mg/dL — ABNORMAL HIGH (ref 8–23)
Calcium, Ion: 1.11 mmol/L — ABNORMAL LOW (ref 1.15–1.40)
Chloride: 104 mmol/L (ref 98–111)
Creatinine, Ser: 0.9 mg/dL (ref 0.44–1.00)
Glucose, Bld: 344 mg/dL — ABNORMAL HIGH (ref 70–99)
HCT: 44 % (ref 36.0–46.0)
Hemoglobin: 15 g/dL (ref 12.0–15.0)
Potassium: 3.7 mmol/L (ref 3.5–5.1)
Sodium: 133 mmol/L — ABNORMAL LOW (ref 135–145)
TCO2: 18 mmol/L — ABNORMAL LOW (ref 22–32)

## 2019-04-26 LAB — COMPREHENSIVE METABOLIC PANEL
ALT: 146 U/L — ABNORMAL HIGH (ref 0–44)
AST: 192 U/L — ABNORMAL HIGH (ref 15–41)
Albumin: 2.8 g/dL — ABNORMAL LOW (ref 3.5–5.0)
Alkaline Phosphatase: 101 U/L (ref 38–126)
Anion gap: 19 — ABNORMAL HIGH (ref 5–15)
BUN: 19 mg/dL (ref 8–23)
CO2: 13 mmol/L — ABNORMAL LOW (ref 22–32)
Calcium: 8.4 mg/dL — ABNORMAL LOW (ref 8.9–10.3)
Chloride: 103 mmol/L (ref 98–111)
Creatinine, Ser: 1.15 mg/dL — ABNORMAL HIGH (ref 0.44–1.00)
GFR calc Af Amer: 57 mL/min — ABNORMAL LOW (ref >60)
GFR calc non Af Amer: 49 mL/min — ABNORMAL LOW (ref >60)
Glucose, Bld: 359 mg/dL — ABNORMAL HIGH (ref 70–99)
Potassium: 3.8 mmol/L (ref 3.5–5.1)
Sodium: 135 mmol/L (ref 135–145)
Total Bilirubin: 0.6 mg/dL (ref 0.3–1.2)
Total Protein: 5.5 g/dL — ABNORMAL LOW (ref 6.5–8.1)

## 2019-04-26 LAB — TROPONIN I (HIGH SENSITIVITY)
Troponin I (High Sensitivity): 98 ng/L — ABNORMAL HIGH (ref <18)
Troponin I (High Sensitivity): 3943 ng/L (ref <18)

## 2019-04-26 LAB — POCT I-STAT 7, (LYTES, BLD GAS, ICA,H+H)
Acid-base deficit: 16 mmol/L — ABNORMAL HIGH (ref 0.0–2.0)
Bicarbonate: 13.8 mmol/L — ABNORMAL LOW (ref 20.0–28.0)
Calcium, Ion: 1.28 mmol/L (ref 1.15–1.40)
HCT: 43 % (ref 36.0–46.0)
Hemoglobin: 14.6 g/dL (ref 12.0–15.0)
O2 Saturation: 84 %
Patient temperature: 98.6
Potassium: 4.1 mmol/L (ref 3.5–5.1)
Sodium: 134 mmol/L — ABNORMAL LOW (ref 135–145)
TCO2: 15 mmol/L — ABNORMAL LOW (ref 22–32)
pCO2 arterial: 46 mmHg (ref 32.0–48.0)
pH, Arterial: 7.084 — CL (ref 7.350–7.450)
pO2, Arterial: 67 mmHg — ABNORMAL LOW (ref 83.0–108.0)

## 2019-04-26 LAB — APTT: aPTT: 53 seconds — ABNORMAL HIGH (ref 24–36)

## 2019-04-26 LAB — LIPASE, BLOOD: Lipase: 86 U/L — ABNORMAL HIGH (ref 11–51)

## 2019-04-26 MED ORDER — CALCIUM CHLORIDE 10 % IV SOLN
INTRAVENOUS | Status: AC | PRN
  Administered 2019-04-26: 1 g via INTRAVENOUS

## 2019-04-26 MED ORDER — FENTANYL CITRATE (PF) 100 MCG/2ML IJ SOLN: 50.0000 ug | Freq: Once | INTRAMUSCULAR | Status: DC

## 2019-04-26 MED ORDER — EPINEPHRINE 1 MG/10ML IJ SOSY
PREFILLED_SYRINGE | INTRAMUSCULAR | Status: AC | PRN
  Administered 2019-04-26 (×4): 1 mg via INTRAVENOUS
  Administered 2019-04-26: 0.5 mg via INTRAVENOUS
  Administered 2019-04-26: 1 mg via INTRAVENOUS

## 2019-04-26 MED ORDER — EPINEPHRINE PF 1 MG/ML IJ SOLN
0.5000 ug/min | INTRAVENOUS | Status: DC
  Administered 2019-04-26: 30 ug/min via INTRAVENOUS
  Administered 2019-04-26: 20 ug/min via INTRAVENOUS
  Filled 2019-04-26 (×4): qty 4

## 2019-04-26 MED ORDER — NOREPINEPHRINE 4 MG/250ML-% IV SOLN: 0.0000 ug/min | INTRAVENOUS | Status: DC

## 2019-04-26 MED ORDER — MORPHINE SULFATE (PF) 4 MG/ML IV SOLN
4.0000 mg | INTRAVENOUS | Status: DC | PRN
  Filled 2019-04-26 (×2): qty 1

## 2019-04-26 MED ORDER — SODIUM BICARBONATE 8.4 % IV SOLN
INTRAVENOUS | Status: AC | PRN
  Administered 2019-04-26 (×2): 50 meq via INTRAVENOUS

## 2019-04-26 MED ORDER — SODIUM CHLORIDE 0.9 % IV SOLN: INTRAVENOUS | Status: DC

## 2019-04-26 MED ORDER — MIDAZOLAM HCL 2 MG/2ML IJ SOLN
4.0000 mg | Freq: Once | INTRAMUSCULAR | Status: DC
  Filled 2019-04-26: qty 4

## 2019-04-26 MED ORDER — NOREPINEPHRINE 16 MG/250ML-% IV SOLN
0.0000 ug/min | INTRAVENOUS | Status: DC
  Administered 2019-04-26: 50 ug/min via INTRAVENOUS
  Filled 2019-04-26: qty 250

## 2019-04-26 MED ORDER — PANTOPRAZOLE SODIUM 40 MG IV SOLR: 40.0000 mg | Freq: Every day | INTRAVENOUS | Status: DC

## 2019-04-26 MED ORDER — MIDAZOLAM HCL 2 MG/2ML IJ SOLN: 4.0000 mg | INTRAMUSCULAR | Status: AC | PRN

## 2019-04-26 MED ORDER — CISATRACURIUM BOLUS VIA INFUSION
0.1000 mg/kg | Freq: Once | INTRAVENOUS | Status: DC
  Filled 2019-04-26: qty 8

## 2019-04-26 MED ORDER — MORPHINE SULFATE (PF) 4 MG/ML IV SOLN
4.0000 mg | INTRAVENOUS | Status: DC | PRN
  Administered 2019-04-26: 4 mg via INTRAVENOUS

## 2019-04-26 MED ORDER — CISATRACURIUM BOLUS VIA INFUSION
0.0500 mg/kg | INTRAVENOUS | Status: DC | PRN
  Filled 2019-04-26: qty 4

## 2019-04-26 MED ORDER — SODIUM CHLORIDE 0.9 % IV BOLUS
500.0000 mL | Freq: Once | INTRAVENOUS | Status: AC
  Administered 2019-04-26: 500 mL via INTRAVENOUS

## 2019-04-26 MED ORDER — SODIUM CHLORIDE 0.9 % IV SOLN
1.0000 ug/kg/min | INTRAVENOUS | Status: DC
  Filled 2019-04-26: qty 20

## 2019-04-26 MED ORDER — MORPHINE SULFATE (PF) 4 MG/ML IV SOLN: 4.0000 mg | INTRAVENOUS | Status: DC | PRN

## 2019-04-26 MED ORDER — DEXTROSE 5 % IV SOLN
INTRAVENOUS | Status: AC | PRN
  Administered 2019-04-26: 150 mg via INTRAVENOUS

## 2019-04-26 MED ORDER — MIDAZOLAM 50MG/50ML (1MG/ML) PREMIX INFUSION: 2.0000 mg/h | INTRAVENOUS | Status: DC

## 2019-04-26 MED ORDER — MIDAZOLAM BOLUS VIA INFUSION
1.0000 mg | INTRAVENOUS | Status: DC | PRN
  Filled 2019-04-26: qty 1

## 2019-04-26 MED ORDER — ARTIFICIAL TEARS OPHTHALMIC OINT: 1.0000 "application " | TOPICAL_OINTMENT | Freq: Three times a day (TID) | OPHTHALMIC | Status: DC

## 2019-04-26 MED ORDER — HEPARIN (PORCINE) 25000 UT/250ML-% IV SOLN: 900.0000 [IU]/h | INTRAVENOUS | Status: DC

## 2019-04-26 MED ORDER — HEPARIN BOLUS VIA INFUSION
4000.0000 [IU] | Freq: Once | INTRAVENOUS | Status: DC
  Filled 2019-04-26: qty 4000

## 2019-04-26 MED ORDER — SODIUM CHLORIDE 0.9 % IV SOLN
INTRAVENOUS | Status: AC | PRN
  Administered 2019-04-26: 1000 mL via INTRAVENOUS

## 2019-04-26 MED ORDER — MIDAZOLAM HCL 2 MG/2ML IJ SOLN
1.0000 mg | Freq: Once | INTRAMUSCULAR | Status: AC
  Administered 2019-04-26: 1 mg via INTRAVENOUS
  Filled 2019-04-26: qty 2

## 2019-04-26 MED ORDER — ASPIRIN 300 MG RE SUPP: 300.0000 mg | RECTAL | Status: DC

## 2019-04-26 MED ORDER — FENTANYL BOLUS VIA INFUSION
25.0000 ug | INTRAVENOUS | Status: DC | PRN
  Filled 2019-04-26: qty 25

## 2019-04-26 MED ORDER — FENTANYL 2500MCG IN NS 250ML (10MCG/ML) PREMIX INFUSION: 100.0000 ug/h | INTRAVENOUS | Status: DC

## 2019-04-27 MED FILL — Medication: Qty: 1 | Status: AC

## 2019-04-28 NOTE — Progress Notes (Addendum)
PCCM Interval Note  Called to bedside to discuss with family regarding transition to comfort care.  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  Present at bedside are patient's two daughters, son, ex-husband and by camera is third daughter.  Family is in complete agreement to withdrawal life support and tell me that they believe "she is already gone".   All questions answered and ongoing support offered.  Family wish to remain at bedside.   Patient has not received paralytics or any sedation since admission per [facility] review.  Currently is on Epi gtt at 30 mcg/min and levophed at 50 mcg/min.  Remains unresponsive on MV.  Blood pressure (!) 73/59, pulse 95, temperature (!) 94.8 F (34.9 C), resp. rate (!) 24, height 5\' 5"  (1.651 m), weight 75.4 kg, SpO2 (!) 85 %.   P:  Patient is already a full DNR Will empirically give dose of morphine and versed for comfort now and prn, however I do not think she will need.  We will then proceed with one-way extubation.  Then withdrawal pressors.  I anticipate patient to pass within minutes.    Ongoing support given.  Additional CCT 30 mins  Kennieth Rad, MSN, AGACNP-BC  Pulmonary & Critical Care Pgr: [phone] or if no answer [phone] 2019/05/15, 8:40 PM

## 2019-04-28 NOTE — ED Notes (Signed)
Pt transported to [place] with Burman Nieves, RN

## 2019-04-28 NOTE — Progress Notes (Signed)
Verdia Kuba RN and [facility] RN listened for absence of heart tones for one minute.  No heart tones were auscultated.

## 2019-04-28 NOTE — H&P (Signed)
NAME:  Veronica Joseph, MRN:  [id number], DOB:  Feb 23, 1951, LOS: 0 ADMISSION DATE:  2019-09-28, CONSULTATION DATE:  2019-09-28 REFERRING MD:  Dr Jacqulyn BathLong CHIEF COMPLAINT:  Post cardiorespiratory arrest  Brief History   68 year old lady who had a witnessed arrest at work She had CPR in the field-EMS documentation lasted about 30 minutes Multiple CPR in the emergency department  She apparently was in normal state of health on 04/25/2019  History of present illness   Collapse at work 30 minutes of CPR out in the field Multiple episodes of losing her pulse in the emergency department Past Medical History   Past Medical History:  Diagnosis Date  . Allergy    Significant Hospital Events   CPR  Consults:  Cardiology  Procedures:    Significant Diagnostic Tests:  CT head-aneurysm, subarachnoid hemorrhage, evidence of ischemic damage  Interim history/subjective:  On vent, on pressors, no brainstem reflexes  Objective   Blood pressure (!) 136/97, pulse 96, resp. rate 16, height 5\' 5"  (1.651 m), weight 75.4 kg, SpO2 (!) 85 %.    Vent Mode: PRVC FiO2 (%):  [100 %] 100 % Set Rate:  [18 bmp-24 bmp] 24 bmp Vt Set:  [450 mL] 450 mL PEEP:  [5 cmH20-10 cmH20] 10 cmH20 Plateau Pressure:  [22 cmH20] 22 cmH20  No intake or output data in the 24 hours ending 2018-12-19 1721 Filed Weights   2018-12-19 1600  Weight: 75.4 kg    Examination: General: Middle-aged lady, nonresponsive HENT: Moist oral mucosa Lungs: Rhonchi bilaterally Cardiovascular: S1-S2 appreciated Abdomen: Soft, bowel sounds appreciated Extremities: No clubbing, no edema Neuro: Unresponsive GU:   Resolved Hospital Problem list     Assessment & Plan:   Cardiorespiratory arrest -Post resuscitation out in the field that lasted about 30 minutes -Multiple events in the emergency department requiring resuscitation -Showing evidence of anoxic injury with no brainstem reflexes  Subarachnoid hemorrhage -Noted on CT scan  of the head, noncontrast -Extensive swelling and extensive bleed with evidence of early development of hydrocephalus -Discussed with neurology -Prognosis is grim  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 VAP protocol (if indicated): In place Family Communication: Discussed with multiple family members Disposition: Comfort measures will be most appropriate  Labs   CBC: Recent Labs  Lab 2018-12-19 1432 2018-12-19 1504 2018-12-19 1614  WBC  --  7.9  --   NEUTROABS  --  2.1  --   HGB 15.0 13.8 14.6  HCT 44.0 45.1 43.0  MCV  --  104.9*  --   PLT  --  281  --     Basic Metabolic Panel: Recent Labs  Lab 2018-12-19 1432 2018-12-19 1504 2018-12-19 1614  NA 133* 135 134*  K 3.7 3.8 4.1  CL 104 103  --   CO2  --  13*  --   GLUCOSE 344* 359*  --   BUN 25* 19  --   CREATININE 0.90 1.15*  --   CALCIUM  --  8.4*  --    GFR: Estimated Creatinine Clearance: 47.6 mL/min (A) (by C-G formula based on SCr of 1.15 mg/dL (H)).  Recent Labs  Lab 05-19-19 1504  WBC 7.9    Liver Function Tests: Recent Labs  Lab 05/19/19 1504  AST 192*  ALT 146*  ALKPHOS 101  BILITOT 0.6  PROT 5.5*  ALBUMIN 2.8*   Recent Labs  Lab 05/19/2019 1504  LIPASE 86*   No results for input(s): AMMONIA in the last 168 hours.  ABG    Component Value Date/Time   PHART 7.084 (LL) 05/19/2019 1614   PCO2ART 46.0 05-19-19 1614   PO2ART 67.0 (L) May 19, 2019 1614   HCO3 13.8 (L) May 19, 2019 1614   TCO2 15 (L) May 19, 2019 1614   ACIDBASEDEF 16.0 (H) 05/19/19  1614   O2SAT 84.0 05/19/19 1614     Coagulation Profile: No results for input(s): INR, PROTIME in the last 168 hours.  Cardiac Enzymes: No results for input(s): CKTOTAL, CKMB, CKMBINDEX, TROPONINI in the last 168 hours.  HbA1C: No results found for: HGBA1C  CBG: No results for input(s): GLUCAP in the last 168 hours.  Review of Systems:   Patient is comatose  Past Medical History  She,  has a past medical history of Allergy.   Surgical History    Past Surgical History:  Procedure Laterality Date  . TUBAL LIGATION       Social History   reports that she quit smoking about 40 years ago. Her smoking use included cigarettes. She smoked 0.25 packs per day. She has never used smokeless tobacco. She reports that she does not drink alcohol or use drugs.   Family History   Her family history is not on file.   Allergies No Known Allergies   Home Medications  Prior to Admission medications   Medication Sig Start Date End Date Taking? Authorizing Provider  cetirizine (ZYRTEC) 10 MG tablet Take 10 mg by mouth daily.    [provider]  ibuprofen (ADVIL,MOTRIN) 200 MG tablet Take 200 mg by mouth 2 (two) times daily.    [provider]  meloxicam (MOBIC) 7.5 MG tablet Take 1 tablet (7.5 mg total) by mouth daily. 03/26/19   Horald Pollen, MD   The patient is critically ill with multiple organ system failure and requires high complexity decision making for assessment and support, frequent evaluation and titration of therapies, advanced monitoring, review of radiographic studies and interpretation of complex data.    Critical Care Time devoted to patient care services, exclusive of separately billable procedures, described in this note is 40 minutes.  Sherrilyn Rist, MD [place], PCCM Cell: [phone]

## 2019-04-28 NOTE — Procedures (Signed)
Extubation Procedure Note  Patient Details:   Name: Veronica Joseph DOB: [birth year] MRN: [id number]   Airway Documentation:    Vent end date: 05/11/2019 Vent end time: 2044   Evaluation  O2 sats: stable throughout Complications: No apparent complications Patient did tolerate procedure well. Bilateral Breath Sounds: Rhonchi   No  Pt was a compassionate extubation per family request and MD order. Family at bedside at time of extubation.   Roby Lofts Tasneem Cormier 11-May-2019, 8:45 PM

## 2019-04-28 NOTE — ED Provider Notes (Signed)
Signout from Dr. Laverta Baltimore.  68 year old female with sudden collapse at work and over an hours worth of intermittent CPR and ROSC.  Currently has a pulse. Physical Exam  BP 132/75   Pulse (!) 102   Resp 13   SpO2 (!) 85%   Physical Exam  ED Course/Procedures   Clinical Course as of Apr 27 907  Mon Apr 26, 2019  1608 Patient was evaluated by Dr. Burt Knack from cardiology who did not feel he needed an acute Cath Lab intervention.  ICU has been down and reportedly is admitting the patient.   [MB]    Clinical Course User Index [MB] Hayden Rasmussen, MD    Procedures  MDM  Family has been contacted and the patient is a DNR from here on in.  Cardiology also consulted and will evaluate the patient in the ED.  Critical care is consulted, but have not called back yet regarding bed assignment.       Hayden Rasmussen, MD 04/27/19 [phone]

## 2019-04-28 NOTE — ED Provider Notes (Signed)
Emergency Department Provider Note   I have reviewed the triage vital signs and the nursing notes.   HISTORY  Chief Complaint CPR   HPI Veronica Joseph is a 68 y.o. female with unknown past medical history presents to the emergency department with CPR in progress.  Patient was reportedly at work and had a witnessed cardiac arrest.  She collapsed in the break room and staff began CPR immediately.  EMS arrived and found the patient to in V-fib initially but apparently quickly converted to PEA arrest.  They gave 300 mg of amiodarone and 5 rounds of epinephrine without ROSC.  Patient had approximately 30 minutes of CPR prior to ED arrival.   Was able to speak with the patient's daughter briefly by phone who states that she spoke with the patient last night and she was apparently feeling fine.   Level 5 caveat: CPR in progress.   Past Medical History:  Diagnosis Date   Allergy     Patient Active Problem List   Diagnosis Date Noted   Cardiopulmonary resuscitation (CPR)-only resuscitation status 04-08-19   Hx of cardiac arrest 04-08-19   Acute pain of right knee 10/06/2018    Past Surgical History:  Procedure Laterality Date   TUBAL LIGATION      Allergies Patient has no known allergies.  No family history on file.  Social History Social History   Tobacco Use   Smoking status: Former Smoker    Packs/day: 0.25    Types: Cigarettes    Quit date: 10/28/1978    Years since quitting: 40.5   Smokeless tobacco: Never Used  Substance Use Topics   Alcohol use: No   Drug use: No    Review of Systems  Level 5 caveat: CPR in progress  ____________________________________________   PHYSICAL EXAM:  VITAL SIGNS: ED Triage Vitals  Enc Vitals Group     BP 2019/08/26 1425 (!) 113/98     Pulse Rate 2019/08/26 1423 (!) 102     Resp 2019/08/26 1423 (!) 102     Temp --      Temp src --      SpO2 2019/08/26 1423 (!) 61 %   Constitutional: Unresponsive. CPR in  progress.  Eyes: Conjunctivae are normal.  Head: Atraumatic. Nose: No congestion/rhinnorhea. Mouth/Throat: Mucous membranes are moist.   Neck: No stridor.  Cardiovascular: PEA arrest on arrival. Cool extremities.  Respiratory: Easy to ventilate with [facility] in place.  Gastrointestinal: Soft and nontender. No distention.  Musculoskeletal: No gross deformities of extremities. Neurologic: Unresponsive.  Skin:  Skin is warm, dry and intact. No rash noted.  ____________________________________________   LABS (all labs ordered are listed, but only abnormal results are displayed)  Labs Reviewed  COMPREHENSIVE METABOLIC PANEL - Abnormal; Notable for the following components:      Result Value   CO2 13 (*)    Glucose, Bld 359 (*)    Creatinine, Ser 1.15 (*)    Calcium 8.4 (*)    Total Protein 5.5 (*)    Albumin 2.8 (*)    AST 192 (*)    ALT 146 (*)    GFR calc non Af Amer 49 (*)    GFR calc Af Amer 57 (*)    Anion gap 19 (*)    All other components within normal limits  LIPASE, BLOOD - Abnormal; Notable for the following components:   Lipase 86 (*)    All other components within normal limits  CBC WITH DIFFERENTIAL/PLATELET - Abnormal;  Notable for the following components:   MCV 104.9 (*)    RDW 11.4 (*)    nRBC 0.6 (*)    Lymphs Abs 5.1 (*)    nRBC 1 (*)    All other components within normal limits  TROPONIN I (HIGH SENSITIVITY) - Abnormal; Notable for the following components:   Troponin I (High Sensitivity) 98 (*)    All other components within normal limits  TROPONIN I (HIGH SENSITIVITY) - Abnormal; Notable for the following components:   Troponin I (High Sensitivity) 3,943 (*)    All other components within normal limits  BASIC METABOLIC PANEL - Abnormal; Notable for the following components:   Sodium 131 (*)    CO2 10 (*)    Glucose, Bld 434 (*)    Creatinine, Ser 1.24 (*)    Calcium 8.8 (*)    GFR calc non Af Amer 45 (*)    GFR calc Af Amer 52 (*)    Anion  gap 20 (*)    All other components within normal limits  PROTIME-INR - Abnormal; Notable for the following components:   Prothrombin Time 17.2 (*)    INR 1.4 (*)    All other components within normal limits  APTT - Abnormal; Notable for the following components:   aPTT 53 (*)    All other components within normal limits  I-STAT CHEM 8, ED - Abnormal; Notable for the following components:   Sodium 133 (*)    BUN 25 (*)    Glucose, Bld 344 (*)    Calcium, Ion 1.11 (*)    TCO2 18 (*)    All other components within normal limits  POCT I-STAT 7, (LYTES, BLD GAS, ICA,H+H) - Abnormal; Notable for the following components:   pH, Arterial 7.084 (*)    pO2, Arterial 67.0 (*)    Bicarbonate 13.8 (*)    TCO2 15 (*)    Acid-base deficit 16.0 (*)    Sodium 134 (*)    All other components within normal limits  SARS CORONAVIRUS 2 (HOSPITAL ORDER, PERFORMED IN [facility])  URINALYSIS, ROUTINE W REFLEX MICROSCOPIC  HIV ANTIBODY (ROUTINE TESTING W REFLEX)  BLOOD GAS, ARTERIAL  PROTIME-INR  APTT  BLOOD GAS, ARTERIAL  BLOOD GAS, ARTERIAL  CBC   ____________________________________________  EKG   EKG Interpretation  Date/Time:  Monday April 26 2019 14:26:01 EDT Ventricular Rate:  64 PR Interval:    QRS Duration: 147 QT Interval:  431 QTC Calculation: 445 R Axis:   -82 Text Interpretation:  Junctional rhythm RBBB and LAFB Probable left ventricular hypertrophy No prior for comparison.  Confirmed by Alona BeneLong, Hudsyn Champine [phone]) on 08/11/19 3:26:57 PM       ____________________________________________  RADIOLOGY  CXR pending ____________________________________________   PROCEDURES  Procedure(s) performed:   .Critical Care Performed by: Maia PlanLong, Thomasina Housley G, MD Authorized by: Maia PlanLong, Nina Mondor G, MD   Critical care provider statement:    Critical care time (minutes):  35   Critical care time was exclusive of:  Separately billable procedures and treating other patients and  teaching time   Critical care was necessary to treat or prevent imminent or life-threatening deterioration of the following conditions:  Cardiac failure and circulatory failure   Critical care was time spent personally by me on the following activities:  Blood draw for specimens, development of treatment plan with patient or surrogate, discussions with consultants, evaluation of patient's response to treatment, examination of patient, ordering and performing treatments and interventions, ordering and review  of laboratory studies, ordering and review of radiographic studies, pulse oximetry, re-evaluation of patient's condition, review of old charts and ventilator management   I assumed direction of critical care for this patient from another provider in my specialty: no   Date/Time: May 10, 2019 3:30 PM Performed by: Maia PlanLong, Rondale Nies G, MD Laryngoscope Size: Glidescope and 4 Grade View: Grade III Tube size: 7.5 mm Number of attempts: 1 Airway Equipment and Method: Video-laryngoscopy Placement Confirmation: ETT inserted through vocal cords under direct vision,  CO2 detector and Breath sounds checked- equal and bilateral Secured at: 25 cm Tube secured with: ETT holder Dental Injury: Teeth and Oropharynx as per pre-operative assessment  Difficulty Due To: Difficulty was anticipated, Difficult Airway- due to limited oral opening and Difficult Airway- due to anterior larynx       Cardiopulmonary Resuscitation (CPR) Procedure Note Directed/Performed by: Maia PlanJoshua G Elbony Mcclimans I personally directed ancillary staff and/or performed CPR in an effort to regain return of spontaneous circulation and to maintain cardiac, neuro and systemic perfusion.   ____________________________________________   INITIAL IMPRESSION / ASSESSMENT AND PLAN / ED COURSE  Pertinent labs & imaging results that were available during my care of the patient were reviewed by me and considered in my medical decision making (see chart for  details).   Presents to the emergency department with CPR in progress.  Patient received continued ACLS in the emergency department. See code documentation for detail.  At one point the patient did have ventricular tachycardia and received a defibrillating shock.  She received additional amiodarone.  Patient intermittently losing pulses and then regaining them after CPR.  Had conversations with the patient's daughters by phone who are not immediately available for in person consultation.  Spoke with the patient's daughter Florentina AddisonKatie [phone].  She agrees that given the total time of CPR and overall poor prognosis with significant instability that she would NOT want additional CPR if the patient loses her pulses again.  She is okay with the current therapies, intubation.   Consulted cardiology and spoke with the STEMI provider on call. EKG not a STEMI pattern currently. They will consult on the patient in the ED.   Unclear reason for cardiac arrest at this time. Patient more stable but will need further stabilization prior to CT.   Discussed patient's case with ICU to request admission. Patient and family (if present) updated with plan. Care transferred to ICU service.  I reviewed all nursing notes, vitals, pertinent old records, EKGs, labs, imaging (as available).  ____________________________________________  FINAL CLINICAL IMPRESSION(S) / ED DIAGNOSES  Final diagnoses:  Cardiac arrest (HCC)  Respiratory arrest (HCC)     MEDICATIONS GIVEN DURING THIS VISIT:  Medications  aspirin suppository 300 mg (has no administration in time range)  cisatracurium (NIMBEX) bolus via infusion 7.5 mg (has no administration in time range)    And  cisatracurium (NIMBEX) 200 mg in sodium chloride 0.9 % 200 mL (1 mg/mL) infusion (has no administration in time range)    And  cisatracurium (NIMBEX) bolus via infusion 3.8 mg (has no administration in time range)  artificial tears (LACRILUBE) ophthalmic  ointment 1 application (has no administration in time range)  0.9 %  sodium chloride infusion (has no administration in time range)  fentaNYL (SUBLIMAZE) injection 50 mcg (has no administration in time range)  fentaNYL 2500mcg in NS 250mL (4810mcg/ml) infusion-PREMIX (has no administration in time range)  fentaNYL (SUBLIMAZE) bolus via infusion 25 mcg (has no administration in time range)  midazolam (VERSED) injection 1  mg (has no administration in time range)  midazolam (VERSED) 50 mg/50 mL (1 mg/mL) premix infusion (has no administration in time range)  midazolam (VERSED) bolus via infusion 1 mg (has no administration in time range)  pantoprazole (PROTONIX) injection 40 mg (has no administration in time range)  EPINEPHrine (ADRENALIN) 4 mg in dextrose 5 % 250 mL (0.016 mg/mL) infusion (30 mcg/min Intravenous Rate/Dose Verify 05/20/19 1844)  heparin bolus via infusion 4,000 Units (has no administration in time range)  norepinephrine (LEVOPHED) 16 mg in 261mL premix infusion (50 mcg/min Intravenous New Bag/Given 05-20-19 1844)  EPINEPHrine (ADRENALIN) 1 MG/10ML injection (1 mg Intravenous Given 05/20/2019 1447)  sodium bicarbonate injection (50 mEq Intravenous Given May 20, 2019 1430)  calcium chloride injection (1 g Intravenous Given 20-May-2019 1419)  amiodarone (CORDARONE) 150 mg in dextrose 5 % 100 mL bolus (150 mg Intravenous New Bag/Given 05/20/19 1424)  0.9 %  sodium chloride infusion (1,000 mLs Intravenous New Bag/Given May 20, 2019 1430)  sodium chloride 0.9 % bolus 500 mL (500 mLs Intravenous New Bag/Given May 20, 2019 1530)    Note:  This document was prepared using Dragon voice recognition software and may include unintentional dictation errors.  Nanda Quinton, MD Emergency Medicine    Emil Weigold, [facility], MD 20-May-2019 [phone]

## 2019-04-28 NOTE — Code Documentation (Signed)
Epi gtt increased to 40 mcg

## 2019-04-28 NOTE — Code Documentation (Signed)
Shock at 200

## 2019-04-28 NOTE — Code Documentation (Signed)
Pulse check: pulses present . 

## 2019-04-28 NOTE — Code Documentation (Signed)
Compressions resumed

## 2019-04-28 NOTE — Progress Notes (Signed)
[facility] Progress Note Patient Name: Veronica Joseph DOB: 07/14/1951 MRN: [id number]   Date of Service  May 19, 2019  HPI/Events of Note  Camera: Discussed with family and bed side RN.  Family decided for comfort care. Father is POA, not in room.  Pulmonary notes seen.   eICU Interventions  - notified bed side NP Veronica Joseph , she will go to  bed side to document comfort care from [place].      Intervention Category Minor Interventions: Communication with other healthcare providers and/or family  Elmer Sow 19-May-2019, 8:00 PM

## 2019-04-28 NOTE — Code Documentation (Signed)
Pulse check, pulse present

## 2019-04-28 NOTE — Discharge Summary (Signed)
DEATH SUMMARY   Patient Details  Name: Veronica Joseph MRN: [id number] DOB: 12/30/1950  Admission/Discharge Information   Admit Date:  2019-09-12  Date of Death: Date of Death: 03-03-19  Time of Death: Time of Death: 2104  Length of Stay: 1  Referring Physician: Patient, No Pcp Per   Reason(s) for Hospitalization  Patient was admitted following a collapse at work  Diagnoses  Preliminary cause of death:   Large subarachnoid hemorrhage Secondary Diagnoses (including complications and co-morbidities):  Active Problems:   Cardiopulmonary resuscitation (CPR)-only resuscitation status   Hx of cardiac arrest Subarachnoid hemorrhage Left ICA aneurysm  Brief Hospital Course (including significant findings, care, treatment, and services provided and events leading to death)  Veronica Joseph is a 68 y.o. year old female who was admitted to the hospital following a collapse at work She did have bystander CPR performed ROSC at about 30 minutes Had multiple episodes of cardiorespiratory arrest in the emergency department Head CT revealed large subarachnoid hemorrhage with evidence of hypoxemic injury to the brain Absence of brainstem reflexes  Following multiple discussions with family members Patient was extubated to comfort measures  Patient was pronounced dead at 2104 on 2019-09-12  Pertinent Labs and Studies  Significant Diagnostic Studies Ct Head Wo Contrast  Addendum Date: 2019-09-12   ADDENDUM REPORT: 02020-11-15 17:09 ADDENDUM: Critical Value/emergent results were called by telephone at the time of interpretation on 2019-09-12 at 1652 hours. To Dr. Virl DiamondADEWALE Lyn Joens , who verbally acknowledged these results. Electronically Signed   By: Odessa FlemingH  Hall M.D.   On: 02020-11-15 17:09   Result Date: 2019-09-12 CLINICAL DATA:  68 year old female collapsed at work status post CPR. EXAM: CT HEAD WITHOUT CONTRAST TECHNIQUE: Contiguous axial images were obtained from the base of the skull through the  vertex without intravenous contrast. COMPARISON:  None. FINDINGS: Brain: Widespread bilateral subarachnoid hemorrhage, predominantly in the basilar cisterns. Superimposed large, oval 15 x 20 millimeter aneurysm near the left ICA terminus or less likely hemorrhagic contusion of the mesial left temporal lobe. Associated intraventricular hemorrhage, but no ventriculomegaly at this time. Possible diffuse loss of cerebral gray-white matter differentiation. No midline shift. No downward mass effect on the brain. Vascular: Suspicion of a large distal left ICA region 2 centimeter intracranial aneurysm. Skull: No acute osseous abnormality identified. Sinuses/Orbits: Trace low-density fluid and bubbly opacity in the bilateral sinuses. Tympanic cavities and mastoids are clear. Other: No acute orbit or scalp soft tissue findings. Partially visible ET tube with fluid in the pharynx. IMPRESSION: 1. Aneurysmal pattern acute subarachnoid hemorrhage with suspicion of a large 2 cm distal left ICA region aneurysm. CTA head and neck would confirm. 2. Questionable diffuse anoxic injury in the form of loss of gray-white matter differentiation. 3. Intraventricular hemorrhage but no ventriculomegaly or significant intracranial mass effect at this time. Electronically Signed: By: Odessa FlemingH  Hall M.D. On: 02020-11-15 16:41   Dg Chest Portable 1 View  Result Date: 2019-09-12 CLINICAL DATA:  Intubation post CPR EXAM: PORTABLE CHEST 1 VIEW COMPARISON:  None. FINDINGS: Endotracheal tube 1 cm above the carina. NG tube enters the stomach with the tip not visualized Diffuse bilateral airspace disease is perihilar and symmetric. No effusion. No pneumothorax. IMPRESSION: Endotracheal tube 1 cm above the carina, recommend withdrawal 2-3 cm. Severe diffuse bilateral airspace disease may represent pulmonary edema versus aspiration. Electronically Signed   By: Marlan Palauharles  Clark M.D.   On: 02020-11-15 16:10    Microbiology Recent Results (from the past 240  hour(s))  SARS Coronavirus 2 (  CEPHEID - Performed in [facility] hospital lab), Hosp Order     Status: None   Collection Time: 05-23-19  3:11 PM   Specimen: Nasopharyngeal Swab  Result Value Ref Range Status   SARS Coronavirus 2 NEGATIVE NEGATIVE Final    Comment: (NOTE) If result is NEGATIVE SARS-CoV-2 target nucleic acids are NOT DETECTED. The SARS-CoV-2 RNA is generally detectable in upper and lower  respiratory specimens during the acute phase of infection. The lowest  concentration of SARS-CoV-2 viral copies this assay can detect is 250  copies / mL. A negative result does not preclude SARS-CoV-2 infection  and should not be used as the sole basis for treatment or other  patient management decisions.  A negative result may occur with  improper specimen collection / handling, submission of specimen other  than nasopharyngeal swab, presence of viral mutation(s) within the  areas targeted by this assay, and inadequate number of viral copies  (<250 copies / mL). A negative result must be combined with clinical  observations, patient history, and epidemiological information. If result is POSITIVE SARS-CoV-2 target nucleic acids are DETECTED. The SARS-CoV-2 RNA is generally detectable in upper and lower  respiratory specimens dur ing the acute phase of infection.  Positive  results are indicative of active infection with SARS-CoV-2.  Clinical  correlation with patient history and other diagnostic information is  necessary to determine patient infection status.  Positive results do  not rule out bacterial infection or co-infection with other viruses. If result is PRESUMPTIVE POSTIVE SARS-CoV-2 nucleic acids MAY BE PRESENT.   A presumptive positive result was obtained on the submitted specimen  and confirmed on repeat testing.  While 2019 novel coronavirus  (SARS-CoV-2) nucleic acids may be present in the submitted sample  additional confirmatory testing may be necessary for  epidemiological  and / or clinical management purposes  to differentiate between  SARS-CoV-2 and other Sarbecovirus currently known to infect humans.  If clinically indicated additional testing with an alternate test  methodology [phone]) is advised. The SARS-CoV-2 RNA is generally  detectable in upper and lower respiratory sp ecimens during the acute  phase of infection. The expected result is Negative. Fact Sheet for Patients:  StrictlyIdeas.no Fact Sheet for Healthcare Providers: BankingDealers.co.za This test is not yet approved or cleared by the [place] FDA and has been authorized for detection and/or diagnosis of SARS-CoV-2 by FDA under an Emergency Use Authorization (EUA).  This EUA will remain in effect (meaning this test can be used) for the duration of the COVID-19 declaration under Section 564(b)(1) of the Act, 21 U.S.C. section 360bbb-3(b)(1), unless the authorization is terminated or revoked sooner. Performed at [facility], [place] [address], [place], [place] [address]     Lab Basic Metabolic Panel: Recent Labs  Lab 23-May-2019 1432 05/23/2019 1504 May 23, 2019 1614 2019/05/23 1723  NA 133* 135 134* 131*  K 3.7 3.8 4.1 4.0  CL 104 103  --  101  CO2  --  13*  --  10*  GLUCOSE 344* 359*  --  434*  BUN 25* 19  --  21  CREATININE 0.90 1.15*  --  1.24*  CALCIUM  --  8.4*  --  8.8*   Liver Function Tests: Recent Labs  Lab May 23, 2019 1504  AST 192*  ALT 146*  ALKPHOS 101  BILITOT 0.6  PROT 5.5*  ALBUMIN 2.8*   Recent Labs  Lab May 23, 2019 1504  LIPASE 86*   No results for input(s): AMMONIA in the  last 168 hours. CBC: Recent Labs  Lab 08/15/19 1432 08/15/19 1504 08/15/19 1614  WBC  --  7.9  --   NEUTROABS  --  2.1  --   HGB 15.0 13.8 14.6  HCT 44.0 45.1 43.0  MCV  --  104.9*  --   PLT  --  281  --    Cardiac Enzymes: No results for input(s): CKTOTAL, CKMB, CKMBINDEX, TROPONINI in the last 168  hours. Sepsis Labs: Recent Labs  Lab 08/15/19 1504  WBC 7.9    Procedures/Operations  None   Sila Sarsfield A Mabry Santarelli 04/28/2019, 2:03 PM

## 2019-04-28 NOTE — Code Documentation (Signed)
Pulse check.   

## 2019-04-28 NOTE — Code Documentation (Signed)
Pulse check, compressions paused, pulse present

## 2019-04-28 NOTE — Code Documentation (Signed)
Pulse check, PEA 

## 2019-04-28 NOTE — Code Documentation (Signed)
Pulse check, VTach

## 2019-04-28 NOTE — Code Documentation (Signed)
Family updated as to patient's status by MD on phone. Reports pt was normal with no complaints yesterday

## 2019-04-28 NOTE — Code Documentation (Signed)
Pulse check, compressions paused

## 2019-04-28 NOTE — Code Documentation (Addendum)
Pulse check, compressions resumed 

## 2019-04-28 NOTE — Code Documentation (Signed)
Compressions paused.  

## 2019-04-28 NOTE — ED Notes (Signed)
Ice packs applied to groin and armpits

## 2019-04-28 NOTE — Progress Notes (Signed)
ANTICOAGULATION CONSULT NOTE - Initial Consult  Pharmacy Consult for Heparin Indication: chest pain/ACS  No Known Allergies  Patient Measurements: Height: 5\' 5"  (165.1 cm) Weight: 166 lb 3.6 oz (75.4 kg) IBW/kg (Calculated) : 57  Vital Signs: BP: 136/97 (06/29 1620) Pulse Rate: 96 (06/29 1620)  Labs: Recent Labs    05-19-2019 1432 2019/05/19 1504 05-19-2019 1614  HGB 15.0 13.8 14.6  HCT 44.0 45.1 43.0  PLT  --  281  --   CREATININE 0.90 1.15*  --   TROPONINIHS  --  98*  --     Estimated Creatinine Clearance: 47.6 mL/min (A) (by C-G formula based on SCr of 1.15 mg/dL (H)).   Medical History: Past Medical History:  Diagnosis Date  . Allergy     Assessment: 68 y.o. female with no past cardiac history who is being seen today for the evaluation of cardiac arrest.Ms. Buzzelli was reportedly in her normal state of health, when she suddenly collapsed at work today.  She received immediate CPR by coworkers.  Her initial rhythm on EMS arrival was ventricular fibrillation but then went into PEA.  She received amiodarone and 5 rounds of epinephrine in the field without return of spontaneous circulation.  She had about 30 minutes of CPR prior to arrival here.  She continued to receive CPR on and off for another hour after arrival here. Patient has been started on a Code Cool.  Pharmacy consulted to start heparin for ACS.  No known history of anticoagulants PTA.    Goal of Therapy:  Heparin level 0.3-0.7 units/ml Monitor platelets by anticoagulation protocol: Yes   Plan:  Give 4000 units bolus x 1 Start heparin infusion at 900 units/hr Check anti-Xa level in 6 hours and daily while on heparin Continue to monitor H&H and platelets  Alanda Slim, PharmD, [facility] Clinical Pharmacist Please see AMION for all Pharmacists' Contact Phone Numbers May 19, 2019, 4:38 PM

## 2019-04-28 NOTE — Procedures (Signed)
Intubation Procedure Note Veronica Joseph [id number] 1950/12/25  Procedure: Intubation Indications: Respiratory insufficiency  Procedure Details Consent: Unable to obtain consent because of emergent medical necessity. Time Out: Verified patient identification, verified procedure, site/side was marked, verified correct patient position, special equipment/implants available, medications/allergies/relevent history reviewed, required imaging and test results available.  Performed  Maximum sterile technique was used including cap, gloves, gown, hand hygiene and mask.  4    Evaluation Hemodynamic Status: BP stable throughout; O2 sats: stable throughout Patient's Current Condition: stable Complications: No apparent complications Patient did tolerate procedure well. Chest X-ray ordered to verify placement.  CXR: pending.   Gonzella Lex May 13, 2019

## 2019-04-28 NOTE — Consult Note (Signed)
Cardiology Consultation:   Patient ID: Veronica Joseph MRN: [id number]; DOB: 1951-01-18  Admit date: 03-Mar-2019 Date of Consult: 03-Mar-2019  Primary Care Provider: Patient, No Pcp Per Primary Cardiologist: No primary care provider on file.  Primary Electrophysiologist:  None    Patient Profile:   Veronica Joseph is a 68 y.o. female with no past cardiac history who is being seen today for the evaluation of cardiac arrest at the request of Dr. Jacqulyn BathLong.  History of Present Illness:   Veronica Joseph was reportedly in her normal state of health, when she suddenly collapsed at work today.  She received immediate CPR by coworkers.  Her initial rhythm on EMS arrival was ventricular fibrillation but then went into PEA.  She received amiodarone and 5 rounds of epinephrine in the field without return of spontaneous circulation.  She had about 30 minutes of CPR prior to arrival here.  She continued to receive CPR on and off for another hour after arrival here.  At the time of my evaluation, she is on epinephrine and norepinephrine infusions with a stable heart rhythm and blood pressure.  The patient apparently lost her pulse intermittently between rounds of medications and CPR.  There was discussion documented with the patient's daughter that she would not want additional CPR but otherwise would be agreeable to continued therapies and ventilatory support.  There is no other information available at the time of my evaluation, other than noting the patient was in her normal state of health yesterday evening.  Heart Pathway Score:     Past Medical History:  Diagnosis Date  . Allergy     Past Surgical History:  Procedure Laterality Date  . TUBAL LIGATION       Home Medications:  Prior to Admission medications   Medication Sig Start Date End Date Taking? Authorizing Provider  cetirizine (ZYRTEC) 10 MG tablet Take 10 mg by mouth daily.    [provider]  ibuprofen (ADVIL,MOTRIN) 200 MG tablet  Take 200 mg by mouth 2 (two) times daily.    [provider]  meloxicam (MOBIC) 7.5 MG tablet Take 1 tablet (7.5 mg total) by mouth daily. 03/26/19   Georgina QuintSagardia, Miguel Jose, MD    Inpatient Medications: Scheduled Meds: . artificial tears  1 application Both Eyes Q8H  . aspirin  300 mg Rectal NOW  . cisatracurium  0.1 mg/kg Intravenous Once  . fentaNYL (SUBLIMAZE) injection  50 mcg Intravenous Once  . midazolam  1 mg Intravenous Once  . pantoprazole (PROTONIX) IV  40 mg Intravenous QHS   Continuous Infusions: . sodium chloride    . sodium chloride    . amiodarone (NEXTERONE) IV bolus only 150 mg/100 mL    . cisatracurium (NIMBEX) infusion    . epinephrine    . fentaNYL infusion INTRAVENOUS    . midazolam    . norepinephrine (LEVOPHED) Adult infusion     PRN Meds: sodium chloride, amiodarone (NEXTERONE) IV bolus only 150 mg/100 mL, calcium chloride, cisatracurium **AND** cisatracurium (NIMBEX) infusion **AND** cisatracurium, EPINEPHrine, fentaNYL, midazolam, sodium bicarbonate  Allergies:   No Known Allergies  Social History:   Social History   Socioeconomic History  . Marital status: Divorced    Spouse name: Not on file  . Number of children: Not on file  . Years of education: Not on file  . Highest education level: Not on file  Occupational History  . Not on file  Social Needs  . Financial resource strain: Not on file  .  Food insecurity    Worry: Not on file    Inability: Not on file  . Transportation needs    Medical: Not on file    Non-medical: Not on file  Tobacco Use  . Smoking status: Former Smoker    Packs/day: 0.25    Types: Cigarettes    Quit date: 10/28/1978    Years since quitting: 40.5  . Smokeless tobacco: Never Used  Substance and Sexual Activity  . Alcohol use: No  . Drug use: No  . Sexual activity: Not on file  Lifestyle  . Physical activity    Days per week: Not on file    Minutes per session: Not on file  . Stress: Not on file   Relationships  . Social Musicianconnections    Talks on phone: Not on file    Gets together: Not on file    Attends religious service: Not on file    Active member of club or organization: Not on file    Attends meetings of clubs or organizations: Not on file    Relationship status: Not on file  . Intimate partner violence    Fear of current or ex partner: Not on file    Emotionally abused: Not on file    Physically abused: Not on file    Forced sexual activity: Not on file  Other Topics Concern  . Not on file  Social History Narrative  . Not on file    Family History:   Not obtainable  ROS:  Not obtainable at the time of my evaluation  Physical Exam/Data:   Vitals:   2018-11-23 1452 2018-11-23 1457 2018-11-23 1500 2018-11-23 1600  BP: (!) 189/76 132/75    Pulse: (!) 102     Resp: (!) 51 13    SpO2: (!) 85%     Weight:    75.4 kg  Height:   5\' 5"  (1.651 m)    No intake or output data in the 24 hours ending 2018-11-23 1621 Last 3 Weights Aug 08, 2019 10/06/2018 11/24/2017  Weight (lbs) 166 lb 3.6 oz 188 lb 175 lb  Weight (kg) 75.4 kg 85.276 kg 79.379 kg     Body mass index is 27.66 kg/m.  General: Intubated, unresponsive HEENT: normal Lymph: no adenopathy Neck: no JVD Endocrine:  No thryomegaly Vascular: No carotid bruits; FA pulses 2+ bilaterally   Cardiac:  normal S1, S2; RRR; no murmur  Lungs:  clear to auscultation bilaterally, no wheezing, rhonchi or rales  Abd: soft, nontender, no hepatomegaly  Ext: no edema Musculoskeletal:  No deformities Skin: warm and dry  Neuro:  unresponsive Psych:  unresponsive  EKG:  The EKG was personally reviewed and demonstrates:  Junctional rhythm 64 bpm, RBBB, LAFB, wide QRS  Telemetry:  Telemetry was personally reviewed and demonstrates:  Junctional rhythm  Relevant CV Studies: pending  Laboratory Data:  High Sensitivity Troponin:   Recent Labs  Lab 2018-11-23 1504  TROPONINIHS 98*     Cardiac EnzymesNo results for input(s):  TROPONINI in the last 168 hours. No results for input(s): TROPIPOC in the last 168 hours.  Chemistry Recent Labs  Lab 2018-11-23 1432 2018-11-23 1504 2018-11-23 1614  NA 133* 135 134*  K 3.7 3.8 4.1  CL 104 103  --   CO2  --  13*  --   GLUCOSE 344* 359*  --   BUN 25* 19  --   CREATININE 0.90 1.15*  --   CALCIUM  --  8.4*  --   GFRNONAA  --  49*  --   GFRAA  --  57*  --   ANIONGAP  --  19*  --     Recent Labs  Lab 05/15/2019 1504  PROT 5.5*  ALBUMIN 2.8*  AST 192*  ALT 146*  ALKPHOS 101  BILITOT 0.6   Hematology Recent Labs  Lab 15-May-2019 1432 05-15-2019 1504 05-15-19 1614  WBC  --  7.9  --   RBC  --  4.30  --   HGB 15.0 13.8 14.6  HCT 44.0 45.1 43.0  MCV  --  104.9*  --   MCH  --  32.1  --   MCHC  --  30.6  --   RDW  --  11.4*  --   PLT  --  281  --    BNPNo results for input(s): BNP, PROBNP in the last 168 hours.  DDimer No results for input(s): DDIMER in the last 168 hours.   Radiology/Studies:  Dg Chest Portable 1 View  Result Date: May 15, 2019 CLINICAL DATA:  Intubation post CPR EXAM: PORTABLE CHEST 1 VIEW COMPARISON:  None. FINDINGS: Endotracheal tube 1 cm above the carina. NG tube enters the stomach with the tip not visualized Diffuse bilateral airspace disease is perihilar and symmetric. No effusion. No pneumothorax. IMPRESSION: Endotracheal tube 1 cm above the carina, recommend withdrawal 2-3 cm. Severe diffuse bilateral airspace disease may represent pulmonary edema versus aspiration. Electronically Signed   By: Franchot Gallo M.D.   On: 05/15/19 16:10    Assessment and Plan:   1. Out-of-hospital cardiac arrest, prolonged CPR, VF and PEA 2. Metabolic acidosis, likely lactic acidosis in setting prolonged CPR 3. Acute respiratory failure, secondary to #1 4. Acute hypoxemic encephalopathy  Recommend supportive care. EKG does not meet STEMI criteria and unclear etiology of arrest at this point. Unfortunately I suspect she has a poor prognosis with prolonged  CPR. Hypothermia being initiated per CCM. Limited Code noted per family wishes. Will check 2D echo. Will trend enzymes and follow with you.  For questions or updates, please contact [facility] Please consult [email or web address] for contact info under   Signed, Sherren Mocha, MD  2019/05/15 4:21 PM

## 2019-04-28 NOTE — Code Documentation (Signed)
Pulse loss. Compressions resumed

## 2019-04-28 NOTE — Code Documentation (Signed)
Epi gtt started at 14:31 at 20 mcg

## 2019-04-28 NOTE — Code Documentation (Signed)
norepi started at 20 mcg/min

## 2019-04-28 NOTE — Code Documentation (Signed)
Pt intubated with 7.5, 25 at teeth

## 2019-04-28 NOTE — Code Documentation (Signed)
Norepi increased to 30 mcg/min

## 2019-04-28 NOTE — ED Notes (Signed)
Pt son Donita Newland [phone]

## 2019-04-28 NOTE — ED Triage Notes (Signed)
EMS reports pt was at work when she collapsed and co-workers began CPR. EMS reports 30 min of CPR, 5 epi and 300 amio en route. Pt arrives on lucas.

## 2019-04-28 NOTE — Progress Notes (Addendum)
Referral made to [facility].  Referral # [id number]. Coordinator will be contacting bedside RN, Warden Fillers shortly.  Also documented in doc flowsheet.

## 2019-04-28 DEATH — deceased

## 2019-05-04 ENCOUNTER — Telehealth: Payer: Self-pay

## 2019-05-04 LAB — HIV ANTIBODY (ROUTINE TESTING W REFLEX): HIV Screen 4th Generation wRfx: NONREACTIVE

## 2019-05-04 NOTE — Telephone Encounter (Signed)
Received dc from [facility].   DC is for burial and a patient of Doctor Olalere.   DC will be taken to Pulmonary Unit for signature.  On 05/05/2019 Received signed dc back from Doctor Ander Slade.  I called the funeral home to let them know the dc was ready for pickup.

## 2020-12-23 IMAGING — CT CT HEAD WITHOUT CONTRAST
2 of 3 series · 14 of 47 positions shown, 17 images · non-contrast
Comparison: None.
COMPARISON: None.

Addendum:
CLINICAL DATA: 68-year-old female collapsed at work status post
CPR.

EXAM:
CT HEAD WITHOUT CONTRAST
TECHNIQUE: Contiguous axial images were obtained from the base of the skull
through the vertex without intravenous contrast.

[Series 3: head 5.0 h30s · axial · 0.46mm/px · z∈[+1283,+1413]mm · 11 of 32 slices shown, 14 images]
[im 3/32  brain]
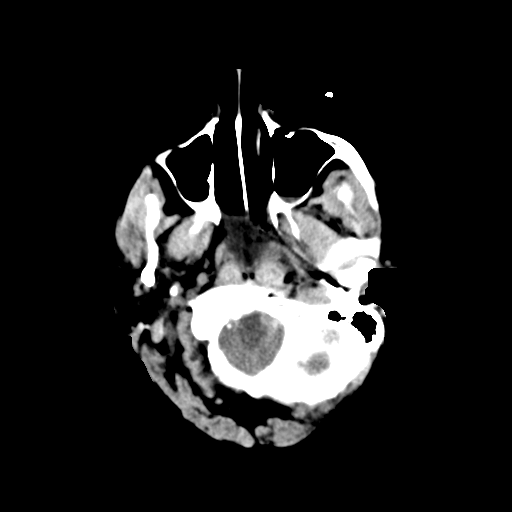
[im 3/32  bone]
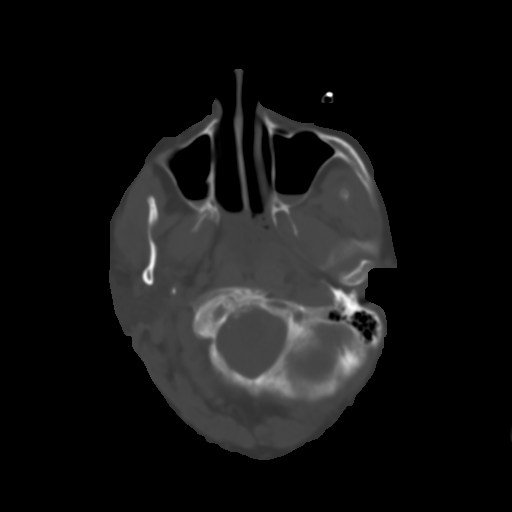
[im 5/32  brain]
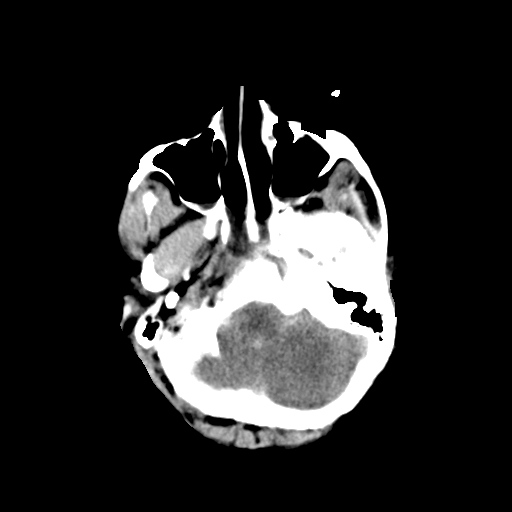
[im 8/32  brain]
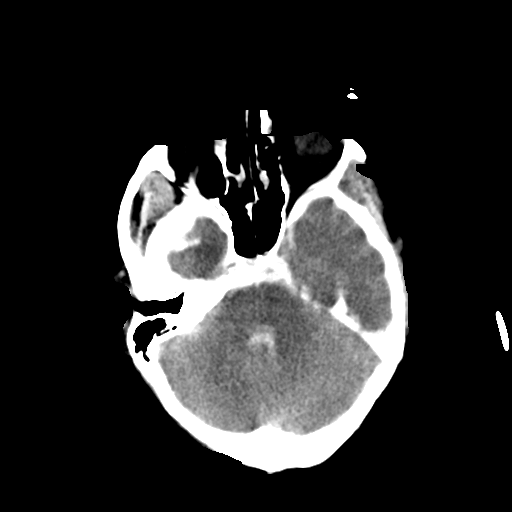
[im 10/32  brain]
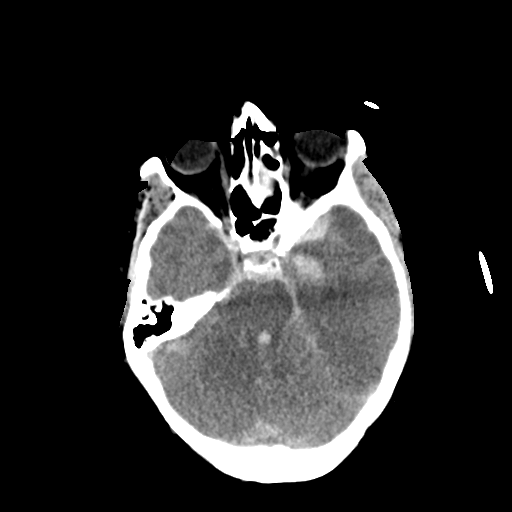
[im 13/32  brain]
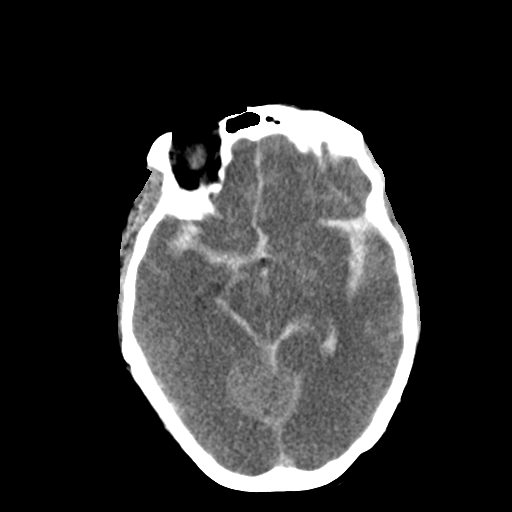
[im 13/32  bone]
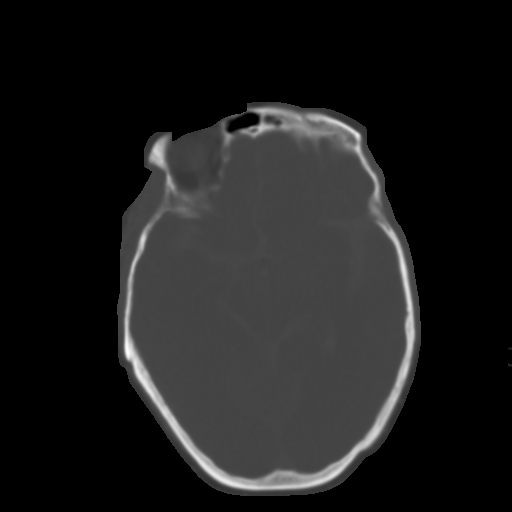
[im 17/32  brain]
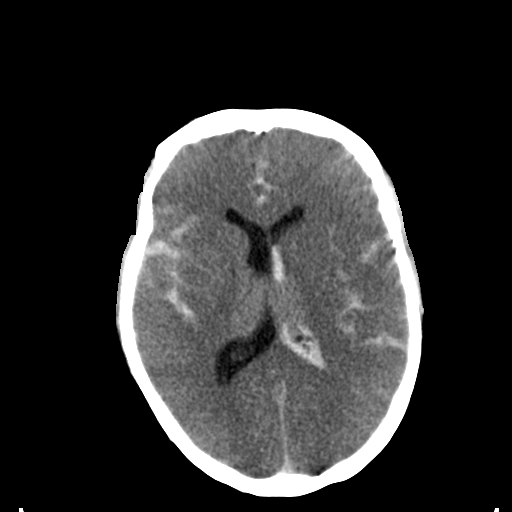
[im 19/32  brain]
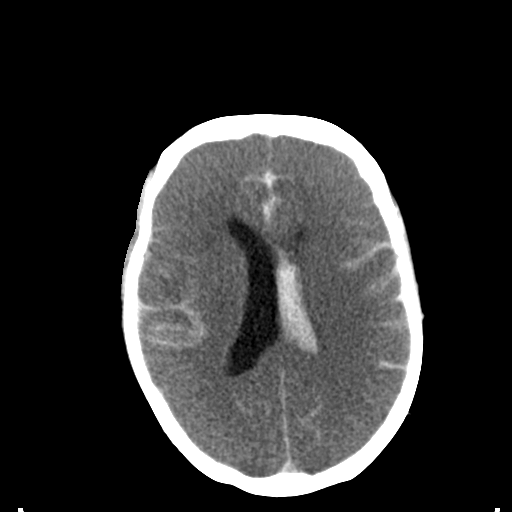
[im 22/32  brain]
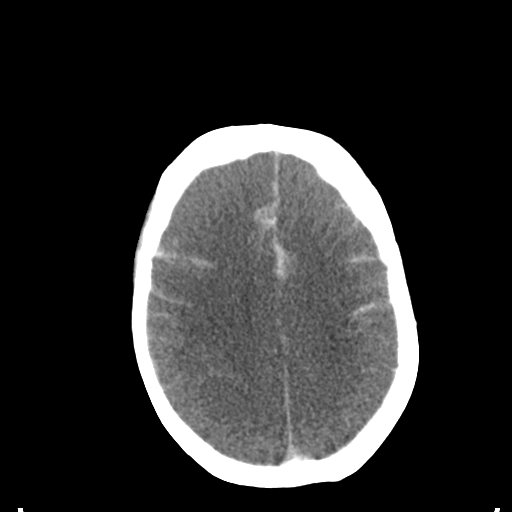
[im 24/32  brain]
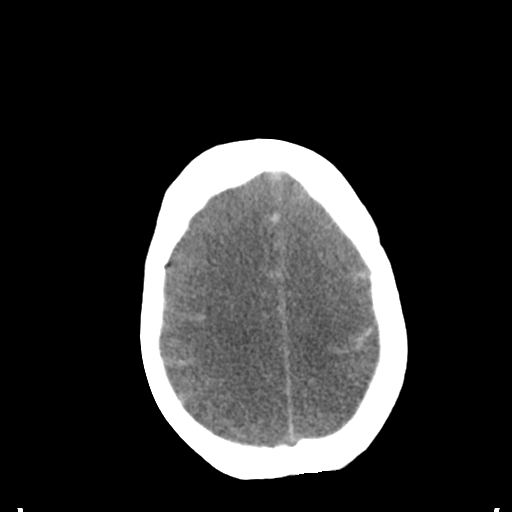
[im 24/32  bone]
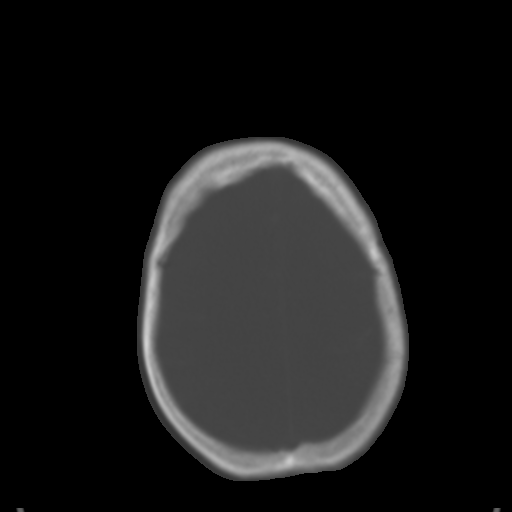
[im 27/32  brain]
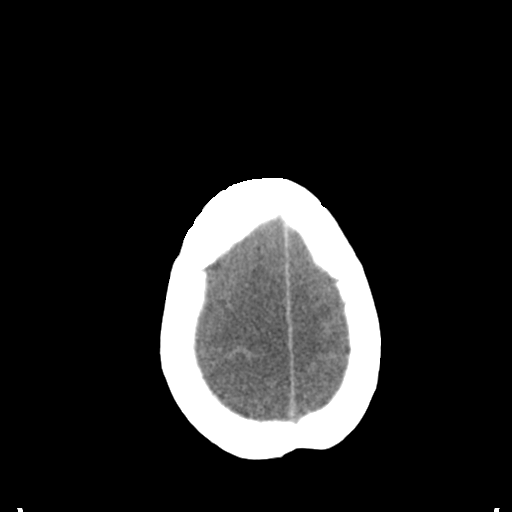
[im 29/32  brain]
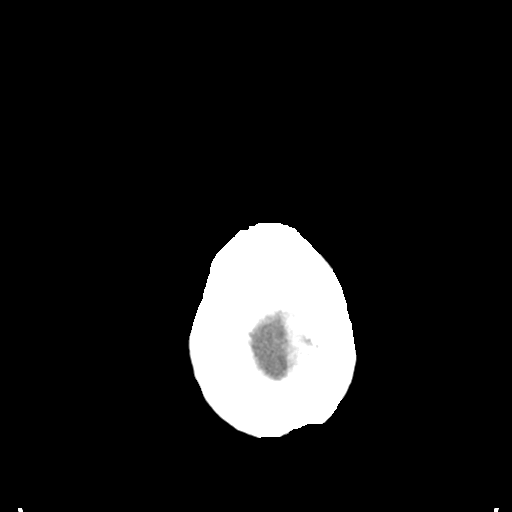

[Series 5: head 3.0 mpr cor · coronal · 0.31mm/px · 3 of 67 slices shown]
[im 23/67  brain]
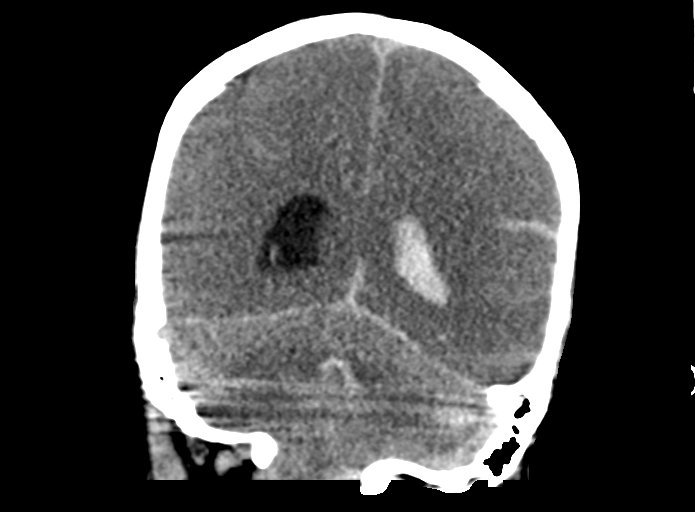
[im 30/67  brain]
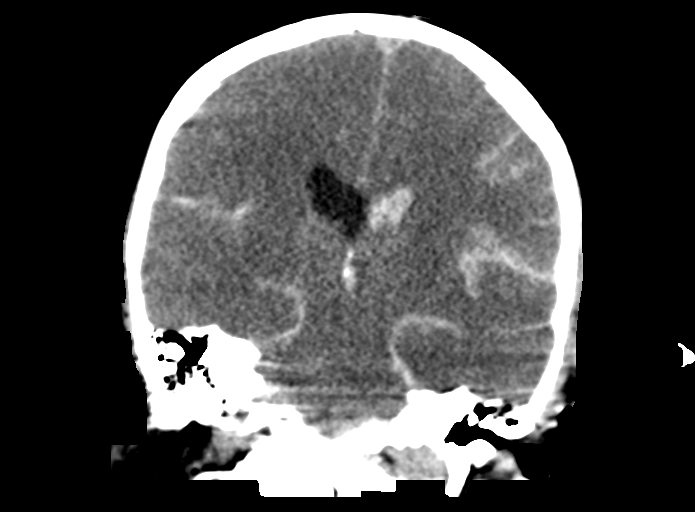
[im 37/67  brain]
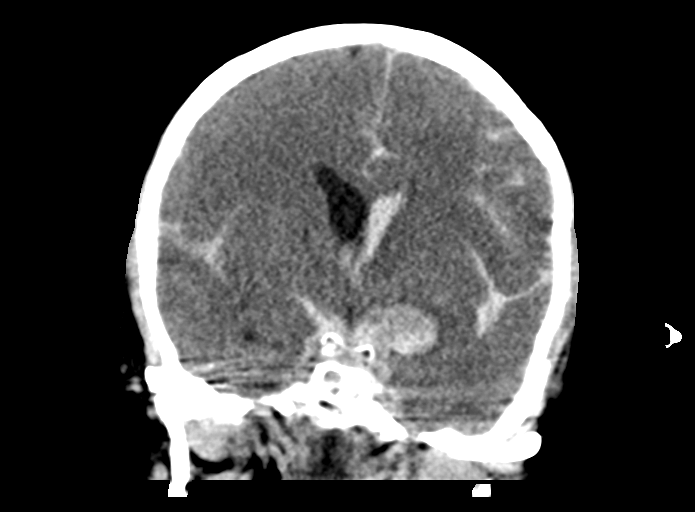

[14 of 47 positions shown; findings below may reference images not displayed]

FINDINGS: Brain: Widespread bilateral subarachnoid hemorrhage, predominantly
in the basilar cisterns. Superimposed large, oval 15 x 20 millimeter
aneurysm near the left ICA terminus or less likely hemorrhagic
contusion of the mesial left temporal lobe.

Associated intraventricular hemorrhage, but no ventriculomegaly at
this time.

Possible diffuse loss of cerebral gray-white matter differentiation.
No midline shift. No downward mass effect on the brain.

Vascular: Suspicion of a large distal left ICA region 2 centimeter
intracranial aneurysm.

Skull: No acute osseous abnormality identified.

Sinuses/Orbits: Trace low-density fluid and bubbly opacity in the
bilateral sinuses. Tympanic cavities and mastoids are clear.

Other: No acute orbit or scalp soft tissue findings. Partially
visible ET tube with fluid in the pharynx.
IMPRESSION: 1. Aneurysmal pattern acute subarachnoid hemorrhage with suspicion
of a large 2 cm distal left ICA region aneurysm. CTA head and neck
would confirm.
2. Questionable diffuse anoxic injury in the form of loss of
gray-white matter differentiation.
3. Intraventricular hemorrhage but no ventriculomegaly or
significant intracranial mass effect at this time.

ADDENDUM:
Critical Value/emergent results were called by telephone at the time
of interpretation on 04/26/2019 at [address]1 hours[address] To Dr. GENREV MARION
, who verbally acknowledged these results.

*** End of Addendum ***
FINDINGS: Brain: Widespread bilateral subarachnoid hemorrhage, predominantly
in the basilar cisterns. Superimposed large, oval 15 x 20 millimeter
aneurysm near the left ICA terminus or less likely hemorrhagic
contusion of the mesial left temporal lobe.

Associated intraventricular hemorrhage, but no ventriculomegaly at
this time.

Possible diffuse loss of cerebral gray-white matter differentiation.
No midline shift. No downward mass effect on the brain.

Vascular: Suspicion of a large distal left ICA region 2 centimeter
intracranial aneurysm.

Skull: No acute osseous abnormality identified.

Sinuses/Orbits: Trace low-density fluid and bubbly opacity in the
bilateral sinuses. Tympanic cavities and mastoids are clear.

Other: No acute orbit or scalp soft tissue findings. Partially
visible ET tube with fluid in the pharynx.
IMPRESSION: 1. Aneurysmal pattern acute subarachnoid hemorrhage with suspicion
of a large 2 cm distal left ICA region aneurysm. CTA head and neck
would confirm.
2. Questionable diffuse anoxic injury in the form of loss of
gray-white matter differentiation.
3. Intraventricular hemorrhage but no ventriculomegaly or
significant intracranial mass effect at this time.
